# Patient Record
Sex: Female | Born: 1940 | Race: White | Hispanic: No | Marital: Single | Smoking: Former smoker
Health system: Southern US, Community
[De-identification: ages and names within clinical notes are randomized; demographics above are authoritative.]

## PROBLEM LIST (undated history)

## (undated) DIAGNOSIS — M8580 Other specified disorders of bone density and structure, unspecified site: Secondary | ICD-10-CM

## (undated) DIAGNOSIS — B029 Zoster without complications: Secondary | ICD-10-CM

## (undated) DIAGNOSIS — E785 Hyperlipidemia, unspecified: Secondary | ICD-10-CM

## (undated) DIAGNOSIS — N8110 Cystocele, unspecified: Secondary | ICD-10-CM

## (undated) DIAGNOSIS — M1990 Unspecified osteoarthritis, unspecified site: Secondary | ICD-10-CM

## (undated) DIAGNOSIS — S83206A Unspecified tear of unspecified meniscus, current injury, right knee, initial encounter: Secondary | ICD-10-CM

## (undated) DIAGNOSIS — I2510 Atherosclerotic heart disease of native coronary artery without angina pectoris: Secondary | ICD-10-CM

## (undated) DIAGNOSIS — D692 Other nonthrombocytopenic purpura: Secondary | ICD-10-CM

## (undated) DIAGNOSIS — D649 Anemia, unspecified: Secondary | ICD-10-CM

## (undated) DIAGNOSIS — K52831 Collagenous colitis: Secondary | ICD-10-CM

## (undated) HISTORY — DX: Unspecified osteoarthritis, unspecified site: M19.90

## (undated) HISTORY — DX: Unspecified tear of unspecified meniscus, current injury, right knee, initial encounter: S83.206A

## (undated) HISTORY — DX: Anemia, unspecified: D64.9

## (undated) HISTORY — DX: Atherosclerotic heart disease of native coronary artery without angina pectoris: I25.10

## (undated) HISTORY — DX: Hyperlipidemia, unspecified: E78.5

## (undated) HISTORY — DX: Zoster without complications: B02.9

## (undated) HISTORY — DX: Other specified disorders of bone density and structure, unspecified site: M85.80

## (undated) HISTORY — DX: Other nonthrombocytopenic purpura: D69.2

## (undated) HISTORY — DX: Collagenous colitis: K52.831

## (undated) HISTORY — DX: Cystocele, unspecified: N81.10

## (undated) HISTORY — PX: BREAST EXCISIONAL BIOPSY: SUR124

## (undated) HISTORY — PX: ABDOMINAL HYSTERECTOMY: SHX81

---

## 2014-08-30 ENCOUNTER — Encounter: Payer: Self-pay | Admitting: Internal Medicine

## 2014-08-31 LAB — PULMONARY FUNCTION TEST

## 2014-09-28 ENCOUNTER — Encounter: Payer: Self-pay | Admitting: Internal Medicine

## 2014-11-08 ENCOUNTER — Encounter: Payer: Self-pay | Admitting: Internal Medicine

## 2015-03-04 DIAGNOSIS — M810 Age-related osteoporosis without current pathological fracture: Secondary | ICD-10-CM | POA: Diagnosis not present

## 2015-03-04 DIAGNOSIS — M546 Pain in thoracic spine: Secondary | ICD-10-CM | POA: Diagnosis not present

## 2015-03-04 DIAGNOSIS — Z6824 Body mass index (BMI) 24.0-24.9, adult: Secondary | ICD-10-CM | POA: Diagnosis not present

## 2015-03-21 DIAGNOSIS — Z6824 Body mass index (BMI) 24.0-24.9, adult: Secondary | ICD-10-CM | POA: Diagnosis not present

## 2015-03-21 DIAGNOSIS — M1990 Unspecified osteoarthritis, unspecified site: Secondary | ICD-10-CM | POA: Diagnosis not present

## 2015-03-21 DIAGNOSIS — M546 Pain in thoracic spine: Secondary | ICD-10-CM | POA: Diagnosis not present

## 2015-06-20 ENCOUNTER — Other Ambulatory Visit: Payer: Self-pay

## 2015-06-20 DIAGNOSIS — Z1231 Encounter for screening mammogram for malignant neoplasm of breast: Secondary | ICD-10-CM

## 2015-07-08 ENCOUNTER — Ambulatory Visit
Admission: RE | Admit: 2015-07-08 | Discharge: 2015-07-08 | Disposition: A | Discharge status: Home / Self Care | Payer: PPO | Source: Ambulatory Visit

## 2015-07-08 DIAGNOSIS — Z1231 Encounter for screening mammogram for malignant neoplasm of breast: Secondary | ICD-10-CM | POA: Diagnosis not present

## 2015-08-17 DIAGNOSIS — E784 Other hyperlipidemia: Secondary | ICD-10-CM | POA: Diagnosis not present

## 2015-08-17 DIAGNOSIS — M810 Age-related osteoporosis without current pathological fracture: Secondary | ICD-10-CM | POA: Diagnosis not present

## 2015-08-24 DIAGNOSIS — D692 Other nonthrombocytopenic purpura: Secondary | ICD-10-CM | POA: Diagnosis not present

## 2015-08-24 DIAGNOSIS — N329 Bladder disorder, unspecified: Secondary | ICD-10-CM | POA: Diagnosis not present

## 2015-08-24 DIAGNOSIS — M1990 Unspecified osteoarthritis, unspecified site: Secondary | ICD-10-CM | POA: Diagnosis not present

## 2015-08-24 DIAGNOSIS — I6529 Occlusion and stenosis of unspecified carotid artery: Secondary | ICD-10-CM | POA: Diagnosis not present

## 2015-08-24 DIAGNOSIS — E7800 Pure hypercholesterolemia, unspecified: Secondary | ICD-10-CM | POA: Diagnosis not present

## 2015-08-24 DIAGNOSIS — Z0000 Encounter for general adult medical examination without abnormal findings: Secondary | ICD-10-CM | POA: Diagnosis not present

## 2015-08-24 DIAGNOSIS — M810 Age-related osteoporosis without current pathological fracture: Secondary | ICD-10-CM | POA: Diagnosis not present

## 2015-08-24 DIAGNOSIS — Z1389 Encounter for screening for other disorder: Secondary | ICD-10-CM | POA: Diagnosis not present

## 2015-08-24 DIAGNOSIS — S83209S Unspecified tear of unspecified meniscus, current injury, unspecified knee, sequela: Secondary | ICD-10-CM | POA: Diagnosis not present

## 2015-08-24 DIAGNOSIS — B029 Zoster without complications: Secondary | ICD-10-CM | POA: Diagnosis not present

## 2015-08-24 DIAGNOSIS — Z6824 Body mass index (BMI) 24.0-24.9, adult: Secondary | ICD-10-CM | POA: Diagnosis not present

## 2015-08-24 DIAGNOSIS — D649 Anemia, unspecified: Secondary | ICD-10-CM | POA: Diagnosis not present

## 2015-08-24 DIAGNOSIS — Z23 Encounter for immunization: Secondary | ICD-10-CM | POA: Diagnosis not present

## 2015-12-14 DIAGNOSIS — Z0100 Encounter for examination of eyes and vision without abnormal findings: Secondary | ICD-10-CM | POA: Diagnosis not present

## 2015-12-14 DIAGNOSIS — H2513 Age-related nuclear cataract, bilateral: Secondary | ICD-10-CM | POA: Diagnosis not present

## 2016-06-06 ENCOUNTER — Other Ambulatory Visit: Payer: Self-pay | Admitting: Internal Medicine

## 2016-06-06 DIAGNOSIS — Z1231 Encounter for screening mammogram for malignant neoplasm of breast: Secondary | ICD-10-CM

## 2016-07-13 ENCOUNTER — Ambulatory Visit
Admission: RE | Admit: 2016-07-13 | Discharge: 2016-07-13 | Disposition: A | Discharge status: Home / Self Care | Payer: PPO | Source: Ambulatory Visit | Attending: Internal Medicine | Admitting: Internal Medicine

## 2016-07-13 DIAGNOSIS — Z1231 Encounter for screening mammogram for malignant neoplasm of breast: Secondary | ICD-10-CM | POA: Diagnosis not present

## 2016-08-16 LAB — LIPID PANEL
CHOLESTEROL: 126 (ref 0–200)
HDL: 46 (ref 35–70)
LDL Cholesterol: 68
TRIGLYCERIDES: 58 (ref 40–160)

## 2016-08-16 LAB — BASIC METABOLIC PANEL
BUN: 20 (ref 4–21)
CREATININE: 0.9 (ref 0.5–1.1)
Glucose: 88
POTASSIUM: 4.8 (ref 3.4–5.3)
SODIUM: 139 (ref 137–147)

## 2016-08-16 LAB — HEPATIC FUNCTION PANEL
ALT: 16 (ref 7–35)
AST: 23 (ref 13–35)
Alkaline Phosphatase: 43 (ref 25–125)
Bilirubin, Total: 0.6

## 2016-08-16 LAB — VITAMIN D 25 HYDROXY (VIT D DEFICIENCY, FRACTURES): Vit D, 25-Hydroxy: 41.8

## 2016-08-21 DIAGNOSIS — M859 Disorder of bone density and structure, unspecified: Secondary | ICD-10-CM | POA: Diagnosis not present

## 2016-08-21 DIAGNOSIS — E7800 Pure hypercholesterolemia, unspecified: Secondary | ICD-10-CM | POA: Diagnosis not present

## 2016-08-21 LAB — HEPATIC FUNCTION PANEL
ALK PHOS: 46 (ref 25–125)
ALT: 20 (ref 7–35)
AST: 22 (ref 13–35)
Bilirubin, Total: 0.6

## 2016-08-21 LAB — BASIC METABOLIC PANEL
BUN: 19 (ref 4–21)
Creatinine: 0.8 (ref 0.5–1.1)
Glucose: 85
Potassium: 4.7 (ref 3.4–5.3)
Sodium: 137 (ref 137–147)

## 2016-08-21 LAB — CBC AND DIFFERENTIAL
HCT: 38 (ref 36–46)
HEMOGLOBIN: 13 (ref 12.0–16.0)
PLATELETS: 227 (ref 150–399)
WBC: 5

## 2016-08-21 LAB — LIPID PANEL
CHOLESTEROL: 127 (ref 0–200)
HDL: 48 (ref 35–70)
LDL CALC: 65
TRIGLYCERIDES: 72 (ref 40–160)

## 2016-08-21 LAB — VITAMIN D 25 HYDROXY (VIT D DEFICIENCY, FRACTURES): VIT D 25 HYDROXY: 51

## 2016-08-23 DIAGNOSIS — Z1212 Encounter for screening for malignant neoplasm of rectum: Secondary | ICD-10-CM | POA: Diagnosis not present

## 2016-08-23 LAB — IFOBT (OCCULT BLOOD): IMMUNOLOGICAL FECAL OCCULT BLOOD TEST: NEGATIVE

## 2016-08-23 LAB — FECAL OCCULT BLOOD, GUAIAC: Fecal Occult Blood: NEGATIVE

## 2016-08-28 DIAGNOSIS — I6529 Occlusion and stenosis of unspecified carotid artery: Secondary | ICD-10-CM | POA: Diagnosis not present

## 2016-08-28 DIAGNOSIS — N3289 Other specified disorders of bladder: Secondary | ICD-10-CM | POA: Diagnosis not present

## 2016-08-28 DIAGNOSIS — Z1389 Encounter for screening for other disorder: Secondary | ICD-10-CM | POA: Diagnosis not present

## 2016-08-28 DIAGNOSIS — R05 Cough: Secondary | ICD-10-CM | POA: Diagnosis not present

## 2016-08-28 DIAGNOSIS — D6489 Other specified anemias: Secondary | ICD-10-CM | POA: Diagnosis not present

## 2016-08-28 DIAGNOSIS — M1990 Unspecified osteoarthritis, unspecified site: Secondary | ICD-10-CM | POA: Diagnosis not present

## 2016-08-28 DIAGNOSIS — E7800 Pure hypercholesterolemia, unspecified: Secondary | ICD-10-CM | POA: Diagnosis not present

## 2016-08-28 DIAGNOSIS — Z6825 Body mass index (BMI) 25.0-25.9, adult: Secondary | ICD-10-CM | POA: Diagnosis not present

## 2016-08-28 DIAGNOSIS — M859 Disorder of bone density and structure, unspecified: Secondary | ICD-10-CM | POA: Diagnosis not present

## 2016-08-28 DIAGNOSIS — Z0000 Encounter for general adult medical examination without abnormal findings: Secondary | ICD-10-CM | POA: Diagnosis not present

## 2016-11-27 DIAGNOSIS — R197 Diarrhea, unspecified: Secondary | ICD-10-CM | POA: Diagnosis not present

## 2016-11-27 DIAGNOSIS — Z6824 Body mass index (BMI) 24.0-24.9, adult: Secondary | ICD-10-CM | POA: Diagnosis not present

## 2016-11-27 LAB — CBC AND DIFFERENTIAL
HCT: 35 — AB (ref 36–46)
HEMOGLOBIN: 11.5 — AB (ref 12.0–16.0)
Platelets: 311 (ref 150–399)
WBC: 4.2

## 2016-11-27 LAB — BASIC METABOLIC PANEL
BUN: 10 (ref 4–21)
Creatinine: 0.8 (ref 0.5–1.1)
Glucose: 94
POTASSIUM: 4.2 (ref 3.4–5.3)
Sodium: 138 (ref 137–147)

## 2016-11-27 LAB — HEPATIC FUNCTION PANEL
ALT: 18 (ref 7–35)
AST: 22 (ref 13–35)
Alkaline Phosphatase: 47 (ref 25–125)
Bilirubin, Total: 0.5

## 2016-11-29 DIAGNOSIS — R197 Diarrhea, unspecified: Secondary | ICD-10-CM | POA: Diagnosis not present

## 2016-12-03 ENCOUNTER — Encounter: Payer: Self-pay | Admitting: Nurse Practitioner

## 2016-12-12 ENCOUNTER — Other Ambulatory Visit (INDEPENDENT_AMBULATORY_CARE_PROVIDER_SITE_OTHER): Payer: PPO

## 2016-12-12 ENCOUNTER — Encounter (INDEPENDENT_AMBULATORY_CARE_PROVIDER_SITE_OTHER): Payer: Self-pay

## 2016-12-12 ENCOUNTER — Ambulatory Visit: Payer: PPO | Admitting: Nurse Practitioner

## 2016-12-12 VITALS — BP 110/68 | HR 72 | Ht 63.0 in | Wt 124.0 lb

## 2016-12-12 DIAGNOSIS — R635 Abnormal weight gain: Secondary | ICD-10-CM | POA: Diagnosis not present

## 2016-12-12 DIAGNOSIS — R197 Diarrhea, unspecified: Secondary | ICD-10-CM

## 2016-12-12 LAB — CBC
HCT: 35 % — ABNORMAL LOW (ref 36.0–46.0)
Hemoglobin: 11.6 g/dL — ABNORMAL LOW (ref 12.0–15.0)
MCHC: 33.2 g/dL (ref 30.0–36.0)
MCV: 94.7 fl (ref 78.0–100.0)
Platelets: 492 10*3/uL — ABNORMAL HIGH (ref 150.0–400.0)
RBC: 3.69 Mil/uL — AB (ref 3.87–5.11)
RDW: 13.7 % (ref 11.5–15.5)
WBC: 6.5 10*3/uL (ref 4.0–10.5)

## 2016-12-12 LAB — BASIC METABOLIC PANEL
BUN: 15 mg/dL (ref 6–23)
CALCIUM: 9.8 mg/dL (ref 8.4–10.5)
CHLORIDE: 104 meq/L (ref 96–112)
CO2: 29 mEq/L (ref 19–32)
CREATININE: 0.78 mg/dL (ref 0.40–1.20)
GFR: 75.9 mL/min (ref >60.00)
Glucose, Bld: 101 mg/dL — ABNORMAL HIGH (ref 70–99)
Potassium: 4.4 mEq/L (ref 3.5–5.1)
Sodium: 139 mEq/L (ref 135–145)

## 2016-12-12 MED ORDER — NA SULFATE-K SULFATE-MG SULF 17.5-3.13-1.6 GM/177ML PO SOLN: ORAL | 0 refills | Status: DC

## 2016-12-12 NOTE — Progress Notes (Signed)
HPI: Patient is a 76 year old female, new to this practice, referred by PCP, Dr. Wylene Simmerisovec for diarrhea. Diarrhea started around 11/16/16 and is mainly postprandial.  Stool watery up to 10 times a day and she has some nocturnal stooling as well. Has intestinal gurgling during the night and has had a few accidents. She had been trying to lose weight before this illness but wasn't successful , since diarrhea started she unintentionally lost several pounds. Patient has never be prone to having loose stools. She tried anti-diarrheal pills but they didn't work so she saw Willis ModenaSue Drinkard, NP.  No medication changes to correlate with onset of diarrhea. No dietary changes. No recent antibiotics or out of country travel. She is able to eat a few things such as toast and rice without having almost immediate diarrhea.  She complains of feeling lightheaded over last couple of weeks, otherwise feels okay. She is drinking pedialyte, Gingerale and water.   [place] and no [place]ps per patient. Declined colonoscopy in 2012 because she wasn't having any problems,   She passed a black stool yesterday and also one in the week prior in the absence of bismuth.   She has intermittent bright red blood on tissue over last few months but attributes it to a hemorrhoid.   PMH: Brother died of unknown cancer   Past Surgical History:  Procedure Laterality Date  . ABDOMINAL HYSTERECTOMY    . BREAST EXCISIONAL BIOPSY     No family history on file. Social History   Tobacco Use  . Smoking status: Not on file  Substance Use Topics  . Alcohol use: Not on file  . Drug use: Not on file   Current Outpatient Medications  Medication Sig Dispense Refill  . Ascorbic Acid (VITAMIN C) 1000 MG tablet Take 1,000 mg daily by mouth.    . B Complex-C-Folic Acid (STRESS B COMPLEX PO) Take by mouth.    . calcium carbonate (OS-CAL - DOSED IN MG OF ELEMENTAL CALCIUM) 1250 (500 Ca) MG tablet Take 1  tablet by mouth.    . cholecalciferol (VITAMIN D) 1000 units tablet Take 1,000 Units 2 (two) times daily by mouth.    . Flaxseed, Linseed, (FLAXSEED OIL PO) Take by mouth.    . Garlic 1000 MG CAPS Take by mouth.    . Multiple Vitamin (MULTIVITAMIN) capsule Take 1 capsule daily by mouth.     No current facility-administered medications for this visit.    Allergies not on file   Review of Systems: All systems reviewed and negative except where noted in HPI.    Physical Exam: BP 110/68   Pulse 72   Ht 5\' 3"  (1.6 m)   Wt 124 lb (56.2 kg)   BMI 21.97 kg/m  Constitutional:  Well-developed, whitefemale in no acute distress. Psychiatric: Normal mood and affect. Behavior is normal. EENT: Pupils normal.  Conjunctivae are normal. No scleral icterus. Neck supple.  Cardiovascular: Normal rate, regular rhythm. No edema Pulmonary/chest: Effort normal and breath sounds normal. No wheezing, rales or rhonchi. Abdominal: Soft, nondistended. Nontender. Bowel sounds active throughout. There are no masses palpable. No hepatomegaly. Lymphadenopathy: No cervical adenopathy noted. Neurological: Alert and oriented to person place and time. Skin: Skin is warm and dry. No rashes noted.   ASSESSMENT AND PLAN:  1. Very pleasant, relatively healthy  76 yo female with several weeks of diarrhea associated with some mild weight loss. Some intermittent rectal bleeding which she attributes to  hemorrhoids. I was able to get stool study results from PCP and they negative.   -Patient is will and able to proceed with a colonoscopy with possible biopsies / polypectomy.  The risks and benefits of the procedure were discussed and the patient agrees to proceed.  -CBC, BMET  Willette ClusterPaula Zya Finkle, NP  12/12/2016, 2:06 PM  Cc: Tisovec, Adelfa Kohichard W, MD

## 2016-12-12 NOTE — Patient Instructions (Signed)
If you are age 78 or older, your body mass index should be between 23-30. Your Body mass index is 21.97 kg/m. If this is out of the aforementioned range listed, please consider follow up with your Primary Care Provider.  If you are age 78 or younger, your body mass index should be between 19-25. Your Body mass index is 21.97 kg/m. If this is out of the aformentioned range listed, please consider follow up with your Primary Care Provider.   Your physician has requested that you go to the basement for the following lab work before leaving today: CBC BMET  You have been scheduled for a colonoscopy. Please follow written instructions given to you at your visit today.  Please pick up your prep supplies at the pharmacy within the next 1-3 days. If you use inhalers (even only as needed), please bring them with you on the day of your procedure. Your physician has requested that you go to www.startemmi.com and enter the access code given to you at your visit today. This web site gives a general overview about your procedure. However, you should still follow specific instructions given to you by our office regarding your preparation for the procedure.  We have sent the following medications to your pharmacy for you to pick up at your convenience: Suprep  Thank you for choosing me and [facility].   Willette ClusterPaula Guenther, NP

## 2016-12-14 ENCOUNTER — Other Ambulatory Visit: Payer: Self-pay

## 2016-12-14 ENCOUNTER — Encounter: Payer: Self-pay | Admitting: Nurse Practitioner

## 2016-12-14 ENCOUNTER — Encounter: Payer: Self-pay | Admitting: Gastroenterology

## 2016-12-14 ENCOUNTER — Ambulatory Visit (AMBULATORY_SURGERY_CENTER): Payer: PPO | Admitting: Gastroenterology

## 2016-12-14 VITALS — BP 113/50 | HR 68 | Temp 97.3°F | Resp 9 | Ht 63.0 in | Wt 124.0 lb

## 2016-12-14 DIAGNOSIS — K529 Noninfective gastroenteritis and colitis, unspecified: Secondary | ICD-10-CM | POA: Diagnosis not present

## 2016-12-14 DIAGNOSIS — R197 Diarrhea, unspecified: Secondary | ICD-10-CM

## 2016-12-14 LAB — HM COLONOSCOPY

## 2016-12-14 MED ORDER — SODIUM CHLORIDE 0.9 % IV SOLN: 500.0000 mL | INTRAVENOUS | Status: DC

## 2016-12-14 MED ORDER — DIPHENOXYLATE-ATROPINE 2.5-0.025 MG PO TABS: 1.0000 | ORAL_TABLET | Freq: Three times a day (TID) | ORAL | 1 refills | Status: DC

## 2016-12-14 NOTE — Op Note (Signed)
[facility] Patient Name: Vanessa Chavez Procedure Date: 12/14/2016 12:40 PM MRN: [id number] Endoscopist: Viviann Spare P. Kasim Mccorkle MD, MD Age: 76 Referring MD:  Date of Birth: [birth year] Gender: Female Account #: [id number] Procedure:                Colonoscopy Indications:              Chronic diarrhea Medicines:                Monitored Anesthesia Care Procedure:                Pre-Anesthesia Assessment:                           - Prior to the procedure, a History and Physical                            was performed, and patient medications and                            allergies were reviewed. The patient's tolerance of                            previous anesthesia was also reviewed. The risks                            and benefits of the procedure and the sedation                            options and risks were discussed with the patient.                            All questions were answered, and informed consent                            was obtained. Prior Anticoagulants: The patient has                            taken no previous anticoagulant or antiplatelet                            agents. ASA Grade Assessment: II - A patient with                            mild systemic disease. After reviewing the risks                            and benefits, the patient was deemed in                            satisfactory condition to undergo the procedure.                           After obtaining informed consent, the colonoscope  was passed under direct vision. Throughout the                            procedure, the patient's blood pressure, pulse, and                            oxygen saturations were monitored continuously. The                            Colonoscope was introduced through the anus and                            advanced to the the cecum, identified by                            appendiceal orifice and ileocecal valve.  The                            colonoscopy was technically difficult and complex                            due to a tortuous colon. The patient tolerated the                            procedure well. The quality of the bowel                            preparation was adequate. The ileocecal valve,                            appendiceal orifice, and rectum were photographed. Scope In: 1:26:12 PM Scope Out: 1:49:39 PM Scope Withdrawal Time: 0 hours 17 minutes 10 seconds  Total Procedure Duration: 0 hours 23 minutes 27 seconds  Findings:                 The perianal and digital rectal examinations were                            normal.                           A single medium-mouthed diverticulum was found in                            the cecum.                           The colon was significantly tortuous which                            prolonged the procedure.                           Internal hemorrhoids were found during  retroflexion. The hemorrhoids were small.                           The exam was otherwise without abnormality. No                            overt inflammatory changes.                           Biopsies for histology were taken with a cold                            forceps from the right colon, left colon and                            transverse colon for evaluation of microscopic                            colitis. Complications:            No immediate complications. Estimated blood loss:                            Minimal. Estimated Blood Loss:     Estimated blood loss was minimal. Impression:               - Diverticulosis in the cecum.                           - Tortuous colon.                           - Internal hemorrhoids.                           - The examination was otherwise normal.                           - Biopsies were taken with a cold forceps from the                            right colon, left colon and  transverse colon for                            evaluation of microscopic colitis. Recommendation:           - Patient has a contact number available for                            emergencies. The signs and symptoms of potential                            delayed complications were discussed with the                            patient. Return to normal activities tomorrow.  Written discharge instructions were provided to the                            patient.                           - Resume previous diet.                           - Continue present medications.                           - Trial of immodium daily if not already done                           - Await pathology results.                           - No repeat colonoscopy screening is needed due to                            age. Willaim RayasSteven P. Jishnu Jenniges MD, MD 12/14/2016 1:54:46 PM This report has been signed electronically.

## 2016-12-14 NOTE — Progress Notes (Signed)
To PACU, VSS. Report to [email or web address] 

## 2016-12-14 NOTE — Patient Instructions (Signed)
YOU HAD AN ENDOSCOPIC PROCEDURE TODAY AT THE [facility]:   Refer to the procedure report that was given to you for any specific questions about what was found during the examination.  If the procedure report does not answer your questions, please call your gastroenterologist to clarify.  If you requested that your care partner not be given the details of your procedure findings, then the procedure report has been included in a sealed envelope for you to review at your convenience later.  YOU SHOULD EXPECT: Some feelings of bloating in the abdomen. Passage of more gas than usual.  Walking can help get rid of the air that was put into your GI tract during the procedure and reduce the bloating. If you had a lower endoscopy (such as a colonoscopy or flexible sigmoidoscopy) you may notice spotting of blood in your stool or on the toilet paper. If you underwent a bowel prep for your procedure, you may not have a normal bowel movement for a few days.  Please Note:  You might notice some irritation and congestion in your nose or some drainage.  This is from the oxygen used during your procedure.  There is no need for concern and it should clear up in a day or so.  SYMPTOMS TO REPORT IMMEDIATELY:   Following lower endoscopy (colonoscopy or flexible sigmoidoscopy):  Excessive amounts of blood in the stool  Significant tenderness or worsening of abdominal pains  Swelling of the abdomen that is new, acute  Fever of 100F or higher   Following upper endoscopy (EGD)  Vomiting of blood or coffee ground material  New chest pain or pain under the shoulder blades  Painful or persistently difficult swallowing  New shortness of breath  Fever of 100F or higher  Black, tarry-looking stools  For urgent or emergent issues, a gastroenterologist can be reached at any hour by calling [phone].   DIET:  We do recommend a small meal at first, but then you may proceed to your regular diet.  Drink  plenty of fluids but you should avoid alcoholic beverages for 24 hours.  ACTIVITY:  You should plan to take it easy for the rest of today and you should NOT DRIVE or use heavy machinery until tomorrow (because of the sedation medicines used during the test).    FOLLOW UP: Our staff will call the number listed on your records the next business day following your procedure to check on you and address any questions or concerns that you may have regarding the information given to you following your procedure. If we do not reach you, we will leave a message.  However, if you are feeling well and you are not experiencing any problems, there is no need to return our call.  We will assume that you have returned to your regular daily activities without incident.  If any biopsies were taken you will be contacted by phone or by letter within the next 1-3 weeks.  Please call us at [phone] if you have not heard about the biopsies in 3 weeks.    SIGNATURES/CONFIDENTIALITY: You and/or your care partner have signed paperwork which will be entered into your electronic medical record.  These signatures attest to the fact that that the information above on your After Visit Summary has been reviewed and is understood.  Full responsibility of the confidentiality of this discharge information lies with you and/or your care-partner.  Diverticulosis, hemorrhoid  informatin given.  Lomotil one tablet every 8  hours as needed.

## 2016-12-14 NOTE — Progress Notes (Signed)
Agree with assessment and plan as outlined.  She warrants a colonoscopy to rule out microscopic colitis and for screening purposes.

## 2016-12-14 NOTE — Progress Notes (Signed)
Called to room to assist during endoscopic procedure.  Patient ID and intended procedure confirmed with present staff. Received instructions for my participation in the procedure from the performing physician.  

## 2016-12-17 ENCOUNTER — Telehealth: Payer: Self-pay | Admitting: *Deleted

## 2016-12-17 NOTE — Telephone Encounter (Signed)
Patient states she had diarrhea Saturday and Sunday but has been taking lomotil and wants to know if that's okay.

## 2016-12-17 NOTE — Telephone Encounter (Signed)
  Follow up Call-  Call back number 12/14/2016  Post procedure Call Back phone  # [phone]  Permission to leave phone message Yes  Some recent data might be hidden     Patient questions:  Message left. Second call.

## 2016-12-17 NOTE — Telephone Encounter (Signed)
  Follow up Call-  Call back number 12/14/2016  Post procedure Call Back phone  # [phone]  Permission to leave phone message Yes  Some recent data might be hidden     Patient questions:  Message left to call us if necessary.

## 2016-12-18 ENCOUNTER — Telehealth: Payer: Self-pay | Admitting: Gastroenterology

## 2016-12-18 NOTE — Telephone Encounter (Signed)
Just sent you a results note for her path from colonoscopy - she is positive for microscopic colitis and have recommended budesonide which will treat her diarrhea. She can take lomotil PRN but don't suspect she will need it after she starts budesonide. Thanks

## 2016-12-18 NOTE — Telephone Encounter (Signed)
Please advise on lomotil, Rx states every 8 hours or do you want it prn. Thanks.

## 2016-12-19 ENCOUNTER — Other Ambulatory Visit: Payer: Self-pay

## 2016-12-19 MED ORDER — BUDESONIDE 3 MG PO CPEP: ORAL_CAPSULE | ORAL | 0 refills | Status: DC

## 2017-02-12 ENCOUNTER — Other Ambulatory Visit: Payer: Self-pay | Admitting: Gastroenterology

## 2017-02-12 NOTE — Telephone Encounter (Signed)
Pt is requesting refill of budesonide.  Did you intend for her to stay on this medication? Thanks

## 2017-02-12 NOTE — Telephone Encounter (Signed)
Vanessa Chavez she has a follow up appointment with me on Thursday, I will address this with her at that time. Can you verify she will be at the appointment? Thanks

## 2017-02-13 NOTE — Telephone Encounter (Signed)
Called and left message for pt that the Rx will be discussed at her appt with Dr. Adela LankArmbruster tomorrow, Thursday, 02-14-17.  Told her to call me if she had any questions.

## 2017-02-14 ENCOUNTER — Encounter (INDEPENDENT_AMBULATORY_CARE_PROVIDER_SITE_OTHER): Payer: Self-pay

## 2017-02-14 ENCOUNTER — Ambulatory Visit (INDEPENDENT_AMBULATORY_CARE_PROVIDER_SITE_OTHER): Payer: PPO | Admitting: Gastroenterology

## 2017-02-14 ENCOUNTER — Encounter: Payer: Self-pay | Admitting: Gastroenterology

## 2017-02-14 VITALS — BP 138/78 | HR 84 | Ht 60.0 in | Wt 120.5 lb

## 2017-02-14 DIAGNOSIS — K52831 Collagenous colitis: Secondary | ICD-10-CM

## 2017-02-14 NOTE — Patient Instructions (Addendum)
If you are age 79 or older, your body mass index should be between 23-30. Your Body mass index is 23.53 kg/m. If this is out of the aforementioned range listed, please consider follow up with your Primary Care Provider.  If you are age 79 or younger, your body mass index should be between 19-25. Your Body mass index is 23.53 kg/m. If this is out of the aformentioned range listed, please consider follow up with your Primary Care Provider.   Please follow up with us as needed.   Thank you for entrusting me with your care and [facility], Dr. Ileene PatrickSteven Armbruster

## 2017-02-14 NOTE — Progress Notes (Signed)
HPI :  77 year old female here for a follow-up visit for microscopic colitis.   She was initially referred to see us in November for severe diarrhea. She underwent a colonoscopy on November 9, showing no polyps and a normal appearing colon. 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 She endorsed taking Aleve roughly once per day for a long time proceeding this diagnosis. She was advised to stop taking Aleve which she has done. She was given a course of budesonide 9 mg over 1 month with a taper over the next month. She states this completely resolved her diarrhea. She is having one formed bowel movement per day. She denies any abdominal pain. She endorses feeling very well. She was very frustrated by the cost of budesonide which was almost $300.  Colonoscopy 12/14/2016 - cecal diverticulum, otherwise normal than hemorrhoids - biopsies consistent with collagenous coliis    Past Medical History:  Diagnosis Date  . Collagenous colitis      Past Surgical History:  Procedure Laterality Date  . ABDOMINAL HYSTERECTOMY    . BREAST EXCISIONAL BIOPSY     History reviewed. No pertinent family history. Social History   Tobacco Use  . Smoking status: Former Smoker    Last attempt to quit: 2008    Years since quitting: 11.0  . Smokeless tobacco: Never Used  Substance Use Topics  . Alcohol use: Yes    Comment: occasional  . Drug use: No   Current Outpatient Medications  Medication Sig Dispense Refill  . Ascorbic Acid (VITAMIN C) 1000 MG tablet Take 1,000 mg daily by mouth.    . B Complex-C-Folic Acid (STRESS B COMPLEX PO) Take by mouth.    . calcium carbonate (OS-CAL - DOSED IN MG OF ELEMENTAL CALCIUM) 1250 (500 Ca) MG tablet Take 1 tablet by mouth.    . cholecalciferol (VITAMIN D) 1000 units tablet Take 1,000 Units 2 (two) times daily by mouth.    . Flaxseed, Linseed, (FLAXSEED OIL PO) Take by mouth.    . Garlic 1000 MG CAPS Take by mouth.    . Multiple Vitamin (MULTIVITAMIN)  capsule Take 1 capsule daily by mouth.     No current facility-administered medications for this visit.    No Known Allergies   Review of Systems: All systems reviewed and negative except where noted in HPI.    No results found.  Physical Exam: BP 138/78 (BP Location: Left Arm, Patient Position: Sitting, Cuff Size: Normal)   Pulse 84   Ht 5' (1.524 m) Comment: height measured without shoes  Wt 120 lb 8 oz (54.7 kg)   BMI 23.53 kg/m  Constitutional: Pleasant,well-developed, female in no acute distress. HEENT: Normocephalic and atraumatic. Conjunctivae are normal. No scleral icterus. Neck supple.  Cardiovascular: Normal rate, regular rhythm.  Pulmonary/chest: Effort normal and breath sounds normal. No wheezing, rales or rhonchi. Abdominal: Soft, nondistended, nontender. There are no masses palpable. No hepatomegaly. Extremities: no edema Lymphadenopathy: No cervical adenopathy noted. Neurological: Alert and oriented to person place and time. Skin: Skin is warm and dry. No rashes noted. Psychiatric: Normal mood and affect. Behavior is normal.   ASSESSMENT AND PLAN: 77 year old female here for follow-up for collagenous colitis, diagnosed this past November. I suspect this is very likely due to NSAID use. I discussed this with the patient and recommend she completely avoid NSAIDs if possible. She can use low-dose Tylenol as needed for arthritis pain.   She is responded very well to stopping NSAIDs and a course of budesonide. Hopefully  if NSAIDs were driving this process, she has not had any recurrence. I counseled her however, that her symptoms could come back over time. If this happens she should contact me for guidance, we may try to manage this with Imodium as the cost of budesonide was quite high for her. She agreed the plan, and follow-up as needed for this issue.  Ileene Patrick, MD [facility] Pager [phone]

## 2017-02-15 ENCOUNTER — Telehealth: Payer: Self-pay | Admitting: Gastroenterology

## 2017-02-15 NOTE — Telephone Encounter (Signed)
Spoke to patient, she had an episode of diarrhea yesterday afternoon, had to take two doses of imodium. This helped calm down everything and feels better today. Looking at yesterday's note, she is instructed to continue treating her symptomatic diarrhea with imodium and understands to call back if this does not help.

## 2017-03-06 DIAGNOSIS — H2513 Age-related nuclear cataract, bilateral: Secondary | ICD-10-CM | POA: Diagnosis not present

## 2017-03-28 DIAGNOSIS — H2511 Age-related nuclear cataract, right eye: Secondary | ICD-10-CM | POA: Diagnosis not present

## 2017-03-28 DIAGNOSIS — H25811 Combined forms of age-related cataract, right eye: Secondary | ICD-10-CM | POA: Diagnosis not present

## 2017-04-05 HISTORY — PX: CATARACT EXTRACTION: SUR2

## 2017-04-25 DIAGNOSIS — H2512 Age-related nuclear cataract, left eye: Secondary | ICD-10-CM | POA: Diagnosis not present

## 2017-04-25 DIAGNOSIS — H25812 Combined forms of age-related cataract, left eye: Secondary | ICD-10-CM | POA: Diagnosis not present

## 2017-05-06 HISTORY — PX: CATARACT EXTRACTION: SUR2

## 2017-06-26 ENCOUNTER — Other Ambulatory Visit: Payer: Self-pay | Admitting: Nurse Practitioner

## 2017-06-26 ENCOUNTER — Other Ambulatory Visit: Payer: Self-pay | Admitting: Internal Medicine

## 2017-06-26 DIAGNOSIS — Z1231 Encounter for screening mammogram for malignant neoplasm of breast: Secondary | ICD-10-CM

## 2017-07-19 ENCOUNTER — Ambulatory Visit
Admission: RE | Admit: 2017-07-19 | Discharge: 2017-07-19 | Disposition: A | Discharge status: Home / Self Care | Payer: Medicare HMO | Source: Ambulatory Visit | Attending: Nurse Practitioner | Admitting: Nurse Practitioner

## 2017-07-19 DIAGNOSIS — Z1231 Encounter for screening mammogram for malignant neoplasm of breast: Secondary | ICD-10-CM

## 2017-08-06 ENCOUNTER — Ambulatory Visit: Payer: Medicare HMO | Admitting: Nurse Practitioner

## 2017-09-25 ENCOUNTER — Encounter: Payer: Self-pay | Admitting: Internal Medicine

## 2017-11-12 ENCOUNTER — Encounter: Payer: Self-pay | Admitting: Nurse Practitioner

## 2017-11-12 ENCOUNTER — Ambulatory Visit (INDEPENDENT_AMBULATORY_CARE_PROVIDER_SITE_OTHER): Payer: Medicare HMO | Admitting: Nurse Practitioner

## 2017-11-12 VITALS — BP 124/82 | HR 78 | Temp 98.0°F | Ht 60.0 in | Wt 128.8 lb

## 2017-11-12 DIAGNOSIS — I739 Peripheral vascular disease, unspecified: Secondary | ICD-10-CM | POA: Diagnosis not present

## 2017-11-12 DIAGNOSIS — R413 Other amnesia: Secondary | ICD-10-CM

## 2017-11-12 DIAGNOSIS — I779 Disorder of arteries and arterioles, unspecified: Secondary | ICD-10-CM | POA: Insufficient documentation

## 2017-11-12 DIAGNOSIS — E785 Hyperlipidemia, unspecified: Secondary | ICD-10-CM

## 2017-11-12 DIAGNOSIS — M810 Age-related osteoporosis without current pathological fracture: Secondary | ICD-10-CM | POA: Diagnosis not present

## 2017-11-12 DIAGNOSIS — D649 Anemia, unspecified: Secondary | ICD-10-CM

## 2017-11-12 NOTE — Patient Instructions (Addendum)
Follow up in 4 weeks for AWV with sara THEN schedule Physical  Fasting blood work prior to appt.

## 2017-11-12 NOTE — Progress Notes (Signed)
Careteam: Patient Care Team: Kirt Boys, DO as PCP - General (Internal Medicine) Armbruster, Willaim Rayas, MD as Consulting Physician (Gastroenterology) Loletha Carrow, MD as Consulting Physician (Ophthalmology)  Advanced Directive information Does Patient Have a Medical Advance Directive?: Yes, Type of Advance Directive: Healthcare Power of [place];Living will  No Known Allergies  Chief Complaint  Patient presents with  . Medical Management of Chronic Issues    pt is being seen to establish care. Pt was previously seen at [facility]. Records received.    HPI: Patient is a 77 y.o. female seen in the office today to establish care.  Changing from [facility], had only been going yearly and it has been a year in July- this was her last physical.   Hyperlipidemia- previously on simvastatin, has not been taking for 4 months.   Anemia- taking iron daily  Taking lot of supplements because she read they were good for her health.   Hx of colitis- Following with Dr Adela Lank, GI due to diarrhea. Was told this was due to NSAIDS  OA- in neck and knees.   Osteopenia- currently cal icum and vit D  Moved to [place] 12 years ago to be closer to her daughter.  Used to be an assistance to a internal medicine doctor.   Review of Systems:  Review of Systems  Constitutional: Negative for chills, fever and weight loss.  HENT: Negative for tinnitus.   Respiratory: Negative for cough, sputum production and shortness of breath.   Cardiovascular: Negative for chest pain, palpitations and leg swelling.  Gastrointestinal: Negative for abdominal pain, constipation, diarrhea and heartburn.       Incontinent of stool   Genitourinary: Negative for dysuria, frequency and urgency.  Musculoskeletal: Positive for joint pain (knees and neck). Negative for back pain, falls and myalgias.  Skin: Negative.   Neurological: Negative for dizziness and headaches.    Psychiatric/Behavioral: Negative for depression and memory loss. The patient does not have insomnia.     Past Medical History:  Diagnosis Date  . CAD (coronary artery disease)   . Collagenous colitis   . Female bladder prolapse   . Herpes zoster   . Hyperlipidemia   . Normocytic anemia   . Osteoarthritis   . Osteopenia   . Right knee meniscal tear   . Senile purpura (HCC)    Past Surgical History:  Procedure Laterality Date  . ABDOMINAL HYSTERECTOMY    . BREAST EXCISIONAL BIOPSY    . CATARACT EXTRACTION Right 04/2017  . CATARACT EXTRACTION Left 05/2017   Social History:   reports that she quit smoking about 11 years ago. She has a 60.00 pack-year smoking history. She has never used smokeless tobacco. She reports that she drinks alcohol. She reports that she does not use drugs.  Family History  Problem Relation Age of Onset  . Pneumonia Mother 44  . Hypertension Mother   . Cirrhosis Father 58       liver  . Cancer - Lung Father   . Cancer Brother        sinuses  . Suicidality Son   . Heart Problems Maternal Grandmother 28  . Cancer Paternal Grandmother   . Cancer Paternal Grandfather 69    Medications: Patient's Medications  New Prescriptions   No medications on file  Previous Medications   ASCORBIC ACID (VITAMIN C) 1000 MG TABLET    Take 1,000 mg daily by mouth.   B COMPLEX-C-FOLIC ACID (STRESS B COMPLEX PO)  Take by mouth.   CALCIUM CARBONATE (OS-CAL - DOSED IN MG OF ELEMENTAL CALCIUM) 1250 (500 CA) MG TABLET    Take 1 tablet by mouth.   CHOLECALCIFEROL (VITAMIN D) 1000 UNITS TABLET    Take 1,000 Units 2 (two) times daily by mouth.   GARLIC 1000 MG CAPS    Take by mouth.   MULTIPLE VITAMIN (MULTIVITAMIN) CAPSULE    Take 1 capsule daily by mouth.   SIMVASTATIN (ZOCOR) 20 MG TABLET    Take 20 mg by mouth daily.  Modified Medications   No medications on file  Discontinued Medications   FLAXSEED, LINSEED, (FLAXSEED OIL PO)    Take by mouth.     Physical  Exam:  Vitals:   11/12/17 0819  BP: 124/82  Pulse: 78  Temp: 98 F (36.7 C)  TempSrc: Oral  SpO2: 96%  Weight: 128 lb 12.8 oz (58.4 kg)  Height: 5' (1.524 m)   Body mass index is 25.15 kg/m.  Physical Exam  Constitutional: She is oriented to person, place, and time. She appears well-developed and well-nourished. No distress.  HENT:  Head: Normocephalic and atraumatic.  Mouth/Throat: Oropharynx is clear and moist. No oropharyngeal exudate.  Eyes: Pupils are equal, round, and reactive to light. Conjunctivae are normal.  Neck: Normal range of motion. Neck supple.  Cardiovascular: Normal rate, regular rhythm and normal heart sounds.  Pulmonary/Chest: Effort normal and breath sounds normal.  Abdominal: Soft. Bowel sounds are normal.  Musculoskeletal: She exhibits no edema or tenderness.  Neurological: She is alert and oriented to person, place, and time.  Skin: Skin is warm and dry. She is not diaphoretic.  Psychiatric: She has a normal mood and affect.    Labs reviewed: Basic Metabolic Panel: Recent Labs    11/27/16 12/12/16 1514  NA 138 139  K 4.2 4.4  CL  --  104  CO2  --  29  GLUCOSE  --  101*  BUN 10 15  CREATININE 0.8 0.78  CALCIUM  --  9.8   Liver Function Tests: Recent Labs    11/27/16  AST 22  ALT 18  ALKPHOS 47   No results for input(s): LIPASE, AMYLASE in the last 8760 hours. No results for input(s): AMMONIA in the last 8760 hours. CBC: Recent Labs    11/27/16 12/12/16 1514  WBC 4.2 6.5  HGB 11.5* 11.6*  HCT 35* 35.0*  MCV  --  94.7  PLT 311 492.0*   Lipid Panel: No results for input(s): CHOL, HDL, LDLCALC, TRIG, CHOLHDL, LDLDIRECT in the last 8760 hours. TSH: No results for input(s): TSH in the last 8760 hours. A1C: No results found for: HGBA1C   Assessment/Plan 1. Memory loss -aware of some memory loss, will get MMSE on next OV. Will get lab work as well.  - CMP; Future - CBC with Differential/Platelets; Future - Lipid Panel;  Future - TSH; Future  2. Osteoporosis, unspecified osteoporosis type, unspecified pathological fracture presence -previously on bisphosphate in the past, will follow up dexa, continues on cal and vit d, encouraged weight bearing exercise.  - DG Bone Density; Future  3. Hyperlipidemia, unspecified hyperlipidemia type Off statin, would like to stay off statin and follow up cholesterol.  - CMP; Future - Lipid Panel; Future  4. Carotid artery disease, unspecified laterality, unspecified type (HCC) Mild carotid plaque noted on 2017 lifeline screening, will need follow up US for evaluation   5. Anemia, unspecified type -will follow up cbc with next labs, continues on iron  supplement   Next appt: 12/10/2017 Vanessa Chavez. Biagio Borg  [facility] [phone]  a

## 2017-12-09 ENCOUNTER — Other Ambulatory Visit: Payer: Self-pay

## 2017-12-09 DIAGNOSIS — R413 Other amnesia: Secondary | ICD-10-CM

## 2017-12-09 DIAGNOSIS — E785 Hyperlipidemia, unspecified: Secondary | ICD-10-CM

## 2017-12-10 ENCOUNTER — Other Ambulatory Visit: Payer: Medicare HMO

## 2017-12-16 ENCOUNTER — Ambulatory Visit (INDEPENDENT_AMBULATORY_CARE_PROVIDER_SITE_OTHER): Payer: Medicare HMO

## 2017-12-16 VITALS — BP 128/72 | HR 99 | Temp 98.4°F | Ht 60.0 in | Wt 127.0 lb

## 2017-12-16 DIAGNOSIS — Z0000 Encounter for general adult medical examination without abnormal findings: Secondary | ICD-10-CM | POA: Diagnosis not present

## 2017-12-16 MED ORDER — ZOSTER VAC RECOMB ADJUVANTED 50 MCG/0.5ML IM SUSR: 0.5000 mL | Freq: Once | INTRAMUSCULAR | 1 refills | Status: AC

## 2017-12-16 MED ORDER — SIMVASTATIN 20 MG PO TABS: 20.0000 mg | ORAL_TABLET | Freq: Every day | ORAL | 0 refills | Status: DC

## 2017-12-16 NOTE — Progress Notes (Signed)
Subjective:   Vanessa Chavez is a 77 y.o. female who presents for Medicare Annual (Subsequent) preventive examination.  Last AWV-08/28/2016       Objective:     Vitals: BP 128/72 (BP Location: Left Arm, Patient Position: Sitting)   Pulse 99   Temp 98.4 F (36.9 C) (Oral)   Ht 5' (1.524 m)   Wt 127 lb (57.6 kg)   SpO2 96%   BMI 24.80 kg/m   Body mass index is 24.8 kg/m.  Advanced Directives 12/16/2017 11/12/2017 12/14/2016  Does Patient Have a Medical Advance Directive? Yes Yes No  Type of Estate agent of [place];Living will Healthcare Power of [place];Living will -  Does patient want to make changes to medical advance directive? No - Patient declined - -  Copy of Healthcare Power of Attorney in Chart? No - copy requested No - copy requested -    Tobacco Social History   Tobacco Use  Smoking Status Former Smoker  . Packs/day: 1.50  . Years: 40.00  . Pack years: 60.00  . Last attempt to quit: 2008  . 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 CAD (coronary artery disease)   . Collagenous colitis   . Female bladder prolapse   . Herpes zoster   . Hyperlipidemia   . Normocytic anemia   . Osteoarthritis   . Osteopenia   . Right knee meniscal tear   . Senile purpura (HCC)    Past Surgical History:  Procedure Laterality Date  . ABDOMINAL HYSTERECTOMY    . BREAST EXCISIONAL BIOPSY    . CATARACT EXTRACTION Right 04/2017  . CATARACT EXTRACTION Left  05/2017   Family History  Problem Relation Age of Onset  . Pneumonia Mother 55  . Hypertension Mother   . Cirrhosis Father 51       liver  . Cancer - Lung Father   . Cancer Brother        sinuses  . Suicidality Son   . Heart Problems Maternal Grandmother 59   Social History   Socioeconomic History  . Marital status: Single    Spouse name: Not on file  . Number of children: Not on file  . Years of education: Not on file  . Highest education level: Not on file  Occupational History  . Not on file  Social Needs  . Financial resource strain: Not hard at all  . Food insecurity:    Worry: Never true    Inability: Never true  . Transportation needs:    Medical: No    Non-medical: No  Tobacco Use  . Smoking status: Former Smoker    Packs/day: 1.50    Years: 40.00    Pack years: 60.00    Last attempt to quit: 2008    Years since quitting: 11.8  . Smokeless tobacco: Never Used  Substance and Sexual Activity  . Alcohol use: Yes    Frequency: Never    Comment: occasional  .  Drug use: No  . Sexual activity: Not Currently  Lifestyle  . Physical activity:    Days per week: 7 days    Minutes per session: 20 min  . Stress: Not at all  Relationships  . Social connections:    Talks on phone: Once a week    Gets together: Once a week    Attends religious service: Never    Active member of club or organization: No    Attends meetings of clubs or organizations: Never    Relationship status: Never married  Other Topics Concern  . Not on file  Social History Narrative   Social History      Diet?       Do you drink/eat things with caffeine? yes      Marital status?               D 1986                     What year were you married? 1963      Do you live in a house, apartment, assisted living, condo, trailer, etc.? Townhouse      Is it one or more stories? Single level      How many persons live in your home? Only me      Do you have any pets in your home? (please  list)      Highest level of education completed? RN      Current or past profession: Psychiatrist in [place]      Do you exercise?           Go to gym                           Type & how often? Walk on treadmill. Walk outside when weather permits      Advanced Directives      Do you have a living will? yes      Do you have a DNR form?            yes                      If not, do you want to discuss one? no      Do you have signed POA/HPOA for forms? yes      Functional Status      Do you have difficulty bathing or dressing yourself? no      Do you have difficulty preparing food or eating? no      Do you have difficulty managing your medications? no      Do you have difficulty managing your finances? no      Do you have difficulty affording your medications? no    Outpatient Encounter Medications as of 12/16/2017  Medication Sig  . Ascorbic Acid (VITAMIN C) 1000 MG tablet Take 1,000 mg daily by mouth.  . B Complex-C-Folic Acid (STRESS B COMPLEX PO) Take by mouth.  . calcium carbonate (OS-CAL - DOSED IN MG OF ELEMENTAL CALCIUM) 1250 (500 Ca) MG tablet Take 1 tablet by mouth.  . cholecalciferol (VITAMIN D) 1000 units tablet Take 1,000 Units 2 (two) times daily by mouth.  . Garlic 1000 MG CAPS Take by mouth.  . Multiple Vitamin (MULTIVITAMIN) capsule Take 1 capsule daily by mouth.  . simvastatin (ZOCOR) 20 MG tablet Take 1 tablet (20 mg total) by mouth daily.  Marland Kitchen Zoster  Vaccine Adjuvanted [facility]) injection Inject 0.5 mLs into the muscle once for 1 dose.  . [DISCONTINUED] simvastatin (ZOCOR) 20 MG tablet Take 20 mg by mouth daily.  . [DISCONTINUED] Zoster Vaccine Adjuvanted [facility]) injection Inject 0.5 mLs into the muscle once.   No facility-administered encounter medications on file as of 12/16/2017.     Activities of Daily Living In your present state of health, do you have any difficulty performing the following activities: 12/16/2017  Hearing? N  Vision? N    Difficulty concentrating or making decisions? Y  Walking or climbing stairs? N  Dressing or bathing? N  Doing errands, shopping? N  Preparing Food and eating ? N  Using the Toilet? N  In the past six months, have you accidently leaked urine? Y  Do you have problems with loss of bowel control? N  Managing your Medications? N  Managing your Finances? N  Housekeeping or managing your Housekeeping? N  Some recent data might be hidden    Patient Care Team: Kirt Boys, DO as PCP - General (Internal Medicine) Armbruster, Willaim Rayas, MD as Consulting Physician (Gastroenterology) Loletha Carrow, MD as Consulting Physician (Ophthalmology)    Assessment:   This is a routine wellness examination for Vanessa Chavez.  Exercise Activities and Dietary recommendations Current Exercise Habits: Home exercise routine, Type of exercise: walking, Time (Minutes): 20, Frequency (Times/Week): 7, Weekly Exercise (Minutes/Week): 140, Exercise limited by: None identified  Goals   None     Fall Risk Fall Risk  12/16/2017 11/12/2017  Falls in the past year? 0 No  Number falls in past yr: 0 -  Injury with Fall? 0 -   Is the patient's home free of loose throw rugs in walkways, pet beds, electrical cords, etc?   yes      Grab bars in the bathroom? yes      Handrails on the stairs?   yes      Adequate lighting?   yes  Depression Screen PHQ 2/9 Scores 12/16/2017 11/12/2017  PHQ - 2 Score 1 0     Cognitive Function MMSE - Mini Mental State Exam 12/16/2017  Orientation to time 5  Orientation to Place 5  Registration 3  Attention/ Calculation 5  Recall 2  Language- name 2 objects 2  Language- repeat 1  Language- follow 3 step command 3  Language- read & follow direction 1  Write a sentence 1  Copy design 1  Total score 29        Immunization History  Administered Date(s) Administered  . Influenza, High Dose Seasonal PF 11/22/2016, 10/31/2017  . Influenza-Unspecified 11/21/2010, 12/11/2011,  12/11/2014, 10/29/2015  . Pneumococcal Conjugate-13 08/24/2015  . Pneumococcal Polysaccharide-23 05/07/2006  . Tetanus 02/05/2001, 07/17/2011  . Zoster 10/04/2005    Qualifies for Shingles Vaccine? Due, ordered to pharmacy  Screening Tests Health Maintenance  Topic Date Due  . DEXA SCAN  11/05/2003  . TETANUS/TDAP  07/16/2021  . INFLUENZA VACCINE  Completed  . PNA vac Low Risk Adult  Completed    Cancer Screenings: Lung: Low Dose CT Chest recommended if Age 7-80 years, 30 pack-year currently smoking OR have quit w/in 15years. Patient does not qualify. Breast:  Up to date on Mammogram? Yes   Up to date of Bone Density/Dexa? No. Scheduled for December Colorectal: up to date  Additional Screenings:  Hepatitis C Screening: declined     Plan:    I have personally reviewed and addressed the Medicare Annual Wellness questionnaire and have noted the following in  the patient's chart:  A. Medical and social history B. Use of alcohol, tobacco or illicit drugs  C. Current medications and supplements D. Functional ability and status E.  Nutritional status F.  Physical activity G. Advance directives H. List of other physicians I.  Hospitalizations, surgeries, and ER visits in previous 12 months J.  Vitals K. Screenings to include hearing, vision, cognitive, depression L. Referrals and appointments - none  In addition, I have reviewed and discussed with patient certain preventive protocols, quality metrics, and best practice recommendations. A written personalized care plan for preventive services as well as general preventive health recommendations were provided to patient.  See attached scanned questionnaire for additional information.   Signed,   Tyron Russell, RN Nurse Health Advisor  Patient Concerns: Loose stools for the last couple years but it has gotten worse. Lightheadedness when she stands up for the last year.

## 2017-12-16 NOTE — Patient Instructions (Signed)
Vanessa Chavez , Thank you for taking time to come for your Medicare Wellness Visit. I appreciate your ongoing commitment to your health goals. Please review the following plan we discussed and let me know if I can assist you in the future.   Screening recommendations/referrals: Colonoscopy excluded, over age 79 Mammogram excluded, over age 90 Bone Density due, scheduled for December Recommended yearly ophthalmology/optometry visit for glaucoma screening and checkup Recommended yearly dental visit for hygiene and checkup  Vaccinations: Influenza vaccine up to date Pneumococcal vaccine up to date, completed Tdap vaccine up to date, due 07/16/2021 Shingles vaccine due, ordered to pharmacy    Advanced directives: Please bring [place] a copy of your living will and health care power of attorney  Conditions/risks identified: none  Next appointment: Tyron Russell, RN 12/19/2018 @ 9:30am   Preventive Care 65 Years and Older, Female Preventive care refers to lifestyle choices and visits with your health care provider that can promote health and wellness. What does preventive care include?  A yearly physical exam. This is also called an annual well check.  Dental exams once or twice a year.  Routine eye exams. Ask your health care provider how often you should have your eyes checked.  Personal lifestyle choices, including:  Daily care of your teeth and gums.  Regular physical activity.  Eating a healthy diet.  Avoiding tobacco and drug use.  Limiting alcohol use.  Practicing safe sex.  Taking low-dose aspirin every day.  Taking vitamin and mineral supplements as recommended by your health care provider. What happens during an annual well check? The services and screenings done by your health care provider during your annual well check will depend on your age, overall health, lifestyle risk factors, and family history of disease. Counseling  Your health care provider may ask you  questions about your:  Alcohol use.  Tobacco use.  Drug use.  Emotional well-being.  Home and relationship well-being.  Sexual activity.  Eating habits.  History of falls.  Memory and ability to understand (cognition).  Work and work Astronomer.  Reproductive health. Screening  You may have the following tests or measurements:  Height, weight, and BMI.  Blood pressure.  Lipid and cholesterol levels. These may be checked every 5 years, or more frequently if you are over 58 years old.  Skin check.  Lung cancer screening. You may have this screening every year starting at age 52 if you have a 30-pack-year history of smoking and currently smoke or have quit within the past 15 years.  Fecal occult blood test (FOBT) of the stool. You may have this test every year starting at age 18.  Flexible sigmoidoscopy or colonoscopy. You may have a sigmoidoscopy every 5 years or a colonoscopy every 10 years starting at age 75.  Hepatitis C blood test.  Hepatitis B blood test.  Sexually transmitted disease (STD) testing.  Diabetes screening. This is done by checking your blood sugar (glucose) after you have not eaten for a while (fasting). You may have this done every 1-3 years.  Bone density scan. This is done to screen for osteoporosis. You may have this done starting at age 22.  Mammogram. This may be done every 1-2 years. Talk to your health care provider about how often you should have regular mammograms. Talk with your health care provider about your test results, treatment options, and if necessary, the need for more tests. Vaccines  Your health care provider may recommend certain vaccines, such as:  Influenza vaccine.  This is recommended every year.  Tetanus, diphtheria, and acellular pertussis (Tdap, Td) vaccine. You may need a Td booster every 10 years.  Zoster vaccine. You may need this after age 50.  Pneumococcal 13-valent conjugate (PCV13) vaccine. One dose is  recommended after age 79.  Pneumococcal polysaccharide (PPSV23) vaccine. One dose is recommended after age 75. Talk to your health care provider about which screenings and vaccines you need and how often you need them. This information is not intended to replace advice given to you by your health care provider. Make sure you discuss any questions you have with your health care provider. Document Released: 02/18/2015 Document Revised: 10/12/2015 Document Reviewed: 11/23/2014 Elsevier Interactive Patient Education  2017 [employer] Prevention in the Home Falls can cause injuries. They can happen to people of all ages. There are many things you can do to make your home safe and to help prevent falls. What can I do on the outside of my home?  Regularly fix the edges of walkways and driveways and fix any cracks.  Remove anything that might make you trip as you walk through a door, such as a raised step or threshold.  Trim any bushes or trees on the path to your home.  Use bright outdoor lighting.  Clear any walking paths of anything that might make someone trip, such as rocks or tools.  Regularly check to see if handrails are loose or broken. Make sure that both sides of any steps have handrails.  Any raised decks and porches should have guardrails on the edges.  Have any leaves, snow, or ice cleared regularly.  Use sand or salt on walking paths during winter.  Clean up any spills in your garage right away. This includes oil or grease spills. What can I do in the bathroom?  Use night lights.  Install grab bars by the toilet and in the tub and shower. Do not use towel bars as grab bars.  Use non-skid mats or decals in the tub or shower.  If you need to sit down in the shower, use a plastic, non-slip stool.  Keep the floor dry. Clean up any water that spills on the floor as soon as it happens.  Remove soap buildup in the tub or shower regularly.  Attach bath mats  securely with double-sided non-slip rug tape.  Do not have throw rugs and other things on the floor that can make you trip. What can I do in the bedroom?  Use night lights.  Make sure that you have a light by your bed that is easy to reach.  Do not use any sheets or blankets that are too big for your bed. They should not hang down onto the floor.  Have a firm chair that has side arms. You can use this for support while you get dressed.  Do not have throw rugs and other things on the floor that can make you trip. What can I do in the kitchen?  Clean up any spills right away.  Avoid walking on wet floors.  Keep items that you use a lot in easy-to-reach places.  If you need to reach something above you, use a strong step stool that has a grab bar.  Keep electrical cords out of the way.  Do not use floor polish or wax that makes floors slippery. If you must use wax, use non-skid floor wax.  Do not have throw rugs and other things on the floor that can make  you trip. What can I do with my stairs?  Do not leave any items on the stairs.  Make sure that there are handrails on both sides of the stairs and use them. Fix handrails that are broken or loose. Make sure that handrails are as long as the stairways.  Check any carpeting to make sure that it is firmly attached to the stairs. Fix any carpet that is loose or worn.  Avoid having throw rugs at the top or bottom of the stairs. If you do have throw rugs, attach them to the floor with carpet tape.  Make sure that you have a light switch at the top of the stairs and the bottom of the stairs. If you do not have them, ask someone to add them for you. What else can I do to help prevent falls?  Wear shoes that:  Do not have high heels.  Have rubber bottoms.  Are comfortable and fit you well.  Are closed at the toe. Do not wear sandals.  If you use a stepladder:  Make sure that it is fully opened. Do not climb a closed  stepladder.  Make sure that both sides of the stepladder are locked into place.  Ask someone to hold it for you, if possible.  Clearly mark and make sure that you can see:  Any grab bars or handrails.  First and last steps.  Where the edge of each step is.  Use tools that help you move around (mobility aids) if they are needed. These include:  Canes.  Walkers.  Scooters.  Crutches.  Turn on the lights when you go into a dark area. Replace any light bulbs as soon as they burn out.  Set up your furniture so you have a clear path. Avoid moving your furniture around.  If any of your floors are uneven, fix them.  If there are any pets around you, be aware of where they are.  Review your medicines with your doctor. Some medicines can make you feel dizzy. This can increase your chance of falling. Ask your doctor what other things that you can do to help prevent falls. This information is not intended to replace advice given to you by your health care provider. Make sure you discuss any questions you have with your health care provider. Document Released: 11/18/2008 Document Revised: 06/30/2015 Document Reviewed: 02/26/2014 Elsevier Interactive Patient Education  2017 [employer].

## 2017-12-17 LAB — CBC WITH DIFFERENTIAL/PLATELET
BASOS PCT: 1.4 %
Basophils Absolute: 62 cells/uL (ref 0–200)
Eosinophils Absolute: 334 cells/uL (ref 15–500)
Eosinophils Relative: 7.6 %
HCT: 39.2 % (ref 35.0–45.0)
Hemoglobin: 13.5 g/dL (ref 11.7–15.5)
LYMPHS ABS: 1417 {cells}/uL (ref 850–3900)
MCH: 32 pg (ref 27.0–33.0)
MCHC: 34.4 g/dL (ref 32.0–36.0)
MCV: 92.9 fL (ref 80.0–100.0)
MPV: 9.6 fL (ref 7.5–12.5)
Monocytes Relative: 10.1 %
NEUTROS ABS: 2143 {cells}/uL (ref 1500–7800)
Neutrophils Relative %: 48.7 %
PLATELETS: 251 10*3/uL (ref 140–400)
RBC: 4.22 10*6/uL (ref 3.80–5.10)
RDW: 12.7 % (ref 11.0–15.0)
Total Lymphocyte: 32.2 %
WBC: 4.4 10*3/uL (ref 3.8–10.8)
WBCMIX: 444 {cells}/uL (ref 200–950)

## 2017-12-17 LAB — COMPREHENSIVE METABOLIC PANEL
AG RATIO: 2 (calc) (ref 1.0–2.5)
ALT: 10 U/L (ref 6–29)
AST: 15 U/L (ref 10–35)
Albumin: 4.5 g/dL (ref 3.6–5.1)
Alkaline phosphatase (APISO): 50 U/L (ref 33–130)
BILIRUBIN TOTAL: 0.6 mg/dL (ref 0.2–1.2)
BUN: 15 mg/dL (ref 7–25)
CALCIUM: 9.9 mg/dL (ref 8.6–10.4)
CO2: 30 mmol/L (ref 20–32)
Chloride: 105 mmol/L (ref 98–110)
Creat: 0.8 mg/dL (ref 0.60–0.93)
Globulin: 2.2 g/dL (calc) (ref 1.9–3.7)
Glucose, Bld: 85 mg/dL (ref 65–99)
Potassium: 4.6 mmol/L (ref 3.5–5.3)
SODIUM: 142 mmol/L (ref 135–146)
TOTAL PROTEIN: 6.7 g/dL (ref 6.1–8.1)

## 2017-12-17 LAB — LIPID PANEL
CHOL/HDL RATIO: 4.7 (calc) (ref <5.0)
CHOLESTEROL: 225 mg/dL — AB (ref <200)
HDL: 48 mg/dL — ABNORMAL LOW (ref >50)
LDL CHOLESTEROL (CALC): 139 mg/dL — AB
Non-HDL Cholesterol (Calc): 177 mg/dL (calc) — ABNORMAL HIGH (ref <130)
TRIGLYCERIDES: 233 mg/dL — AB (ref <150)

## 2017-12-17 LAB — RPR: RPR Ser Ql: NONREACTIVE

## 2017-12-17 LAB — TSH: TSH: 3.65 mIU/L (ref 0.40–4.50)

## 2017-12-18 ENCOUNTER — Other Ambulatory Visit: Payer: Self-pay

## 2017-12-18 DIAGNOSIS — E785 Hyperlipidemia, unspecified: Secondary | ICD-10-CM

## 2017-12-19 ENCOUNTER — Telehealth: Payer: Self-pay | Admitting: *Deleted

## 2017-12-19 NOTE — Telephone Encounter (Signed)
Patient called and stated that she received a call from [facility] that she had a Rx for Flexeril ready for pick up. Patient stated that she does not take Flexeril and called wanting to make sure we didn't have it in her medication list or prescribed it. I informed her that it was not in her current medication list and we did not prescribe. She stated she was going to call that office and get it straight. Stated that she sees us now and not them and does not need any Flexeril.

## 2017-12-23 ENCOUNTER — Encounter: Payer: Medicare HMO | Admitting: Nurse Practitioner

## 2017-12-25 ENCOUNTER — Ambulatory Visit (INDEPENDENT_AMBULATORY_CARE_PROVIDER_SITE_OTHER): Payer: Medicare HMO | Admitting: Nurse Practitioner

## 2017-12-25 ENCOUNTER — Encounter: Payer: Self-pay | Admitting: Nurse Practitioner

## 2017-12-25 VITALS — BP 118/62 | HR 73 | Temp 97.8°F | Ht 60.0 in | Wt 129.2 lb

## 2017-12-25 DIAGNOSIS — K591 Functional diarrhea: Secondary | ICD-10-CM

## 2017-12-25 DIAGNOSIS — E785 Hyperlipidemia, unspecified: Secondary | ICD-10-CM | POA: Diagnosis not present

## 2017-12-25 DIAGNOSIS — R413 Other amnesia: Secondary | ICD-10-CM

## 2017-12-25 DIAGNOSIS — M810 Age-related osteoporosis without current pathological fracture: Secondary | ICD-10-CM | POA: Diagnosis not present

## 2017-12-25 DIAGNOSIS — Z7189 Other specified counseling: Secondary | ICD-10-CM | POA: Diagnosis not present

## 2017-12-25 DIAGNOSIS — Z0000 Encounter for general adult medical examination without abnormal findings: Secondary | ICD-10-CM

## 2017-12-25 DIAGNOSIS — D649 Anemia, unspecified: Secondary | ICD-10-CM

## 2017-12-25 NOTE — Progress Notes (Signed)
Provider: Sharon Seller, NP  Patient Care Team: Sharon Seller, NP as PCP - General (Geriatric Medicine) Armbruster, Willaim Rayas, MD as Consulting Physician (Gastroenterology) Loletha Carrow, MD as Consulting Physician (Ophthalmology)  Extended Emergency Contact Information Primary Emergency Contact: Record,Nicole Address: [address]          [place], [place] [address] [place] of [place] Home Phone: [phone] Work Phone: [phone] Mobile Phone: [phone] Relation: Daughter No Known Allergies Code Status: DNR Goals of Care: Advanced Directive information Advanced Directives 12/25/2017  Does Patient Have a Medical Advance Directive? Yes  Type of Advance Directive -  Does patient want to make changes to medical advance directive? No - Patient declined  Copy of Healthcare Power of Attorney in Chart? No - copy requested     Chief Complaint  Patient presents with  . Annual Exam    Yearly check-up, AWV completed on 12/16/17     HPI: Patient is a 77 y.o. female seen in today for an annual wellness exam.   Major illnesses or hospitalization in the last year -none  Depression screen [facility] 2/9 12/25/2017 12/16/2017 11/12/2017  Decreased Interest 0 0 0  Down, Depressed, Hopeless 1 1 0  PHQ - 2 Score 1 1 0    Fall Risk  12/25/2017 12/25/2017 12/16/2017 11/12/2017  Falls in the past year? 0 0 0 No  Number falls in past yr: 0 0 0 -  Injury with Fall? 0 0 0 -   MMSE - Mini Mental State Exam 12/16/2017  Orientation to time 5  Orientation to Place 5  Registration 3  Attention/ Calculation 5  Recall 2  Language- name 2 objects 2  Language- repeat 1  Language- follow 3 step command 3  Language- read & follow direction 1  Write a sentence 1  Copy design 1  Total score 29     Health Maintenance  Topic Date Due  . DEXA SCAN  11/05/2003  . TETANUS/TDAP  07/16/2021  . INFLUENZA VACCINE  Completed  . PNA vac Low Risk Adult  Completed   Ophthalmology-  yearly   Dentition: every 6 months.   Pain:none.   Bone density is scheduled.  mammogram done in June.   Restarted her statin- LDL elevated at 139.   Reports she is predicting she will be dead in the next 2 months, "im okay with it"  No SI or HI. It will come naturally.  Has advance directive but did not bring them.  Request DNR.  Very independent.   Past Medical History:  Diagnosis Date  . CAD (coronary artery disease)   . Collagenous colitis   . Female bladder prolapse   . Herpes zoster   . Hyperlipidemia   . Normocytic anemia   . Osteoarthritis   . Osteopenia   . Right knee meniscal tear   . Senile purpura (HCC)     Past Surgical History:  Procedure Laterality Date  . ABDOMINAL HYSTERECTOMY    . BREAST EXCISIONAL BIOPSY    . CATARACT EXTRACTION Right 04/2017  . CATARACT EXTRACTION Left 05/2017    Social History   Socioeconomic History  . Marital status: Single    Spouse name: Not on file  . Number of children: Not on file  . Years of education: Not on file  . Highest education level: Not on file  Occupational History  . Not on file  Social Needs  . Financial resource strain: Not hard at all  . Food insecurity:  Worry: Never true    Inability: Never true  . Transportation needs:    Medical: No    Non-medical: No  Tobacco Use  . Smoking status: Former Smoker    Packs/day: 1.50    Years: 40.00    Pack years: 60.00    Last attempt to quit: 2008    Years since quitting: 11.8  . Smokeless tobacco: Never Used  Substance and Sexual Activity  . Alcohol use: Yes    Frequency: Never    Comment: occasional  . Drug use: No  . Sexual activity: Not Currently  Lifestyle  . Physical activity:    Days per week: 7 days    Minutes per session: 20 min  . Stress: Not at all  Relationships  . Social connections:    Talks on phone: Once a week    Gets together: Once a week    Attends religious service: Never    Active member of club or organization: No     Attends meetings of clubs or organizations: Never    Relationship status: Never married  Other Topics Concern  . Not on file  Social History Narrative   Social History      Diet?       Do you drink/eat things with caffeine? yes      Marital status?               D 1986                     What year were you married? 1963      Do you live in a house, apartment, assisted living, condo, trailer, etc.? Townhouse      Is it one or more stories? Single level      How many persons live in your home? Only me      Do you have any pets in your home? (please list)      Highest level of education completed? RN      Current or past profession: Psychiatrist in [place]      Do you exercise?           Go to gym                           Type & how often? Walk on treadmill. Walk outside when weather permits      Advanced Directives      Do you have a living will? yes      Do you have a DNR form?            yes                      If not, do you want to discuss one? no      Do you have signed POA/HPOA for forms? yes      Functional Status      Do you have difficulty bathing or dressing yourself? no      Do you have difficulty preparing food or eating? no      Do you have difficulty managing your medications? no      Do you have difficulty managing your finances? no      Do you have difficulty affording your medications? no    Family History  Problem Relation Age of Onset  . Pneumonia Mother 46  . Hypertension Mother   . Cirrhosis Father 1  liver  . Cancer - Lung Father   . Cancer Brother        sinuses  . Suicidality Son   . Heart Problems Maternal Grandmother 47    Review of Systems:  Review of Systems  Constitutional: Negative for activity change, appetite change, fatigue and unexpected weight change.  HENT: Negative for congestion and hearing loss.   Eyes: Negative.   Respiratory: Negative for cough and shortness of breath.   Cardiovascular: Negative  for chest pain, palpitations and leg swelling.  Gastrointestinal: Negative for abdominal pain, constipation and diarrhea.  Genitourinary: Negative for difficulty urinating and dysuria.  Musculoskeletal: Negative for arthralgias and myalgias.  Skin: Negative for color change and wound.  Neurological: Negative for dizziness and weakness.  Psychiatric/Behavioral: Negative for agitation, behavioral problems and confusion.     Allergies as of 12/25/2017   No Known Allergies     Medication List        Accurate as of 12/25/17  1:52 PM. Always use your most recent med list.          calcium carbonate 1250 (500 Ca) MG tablet Commonly known as:  OS-CAL - dosed in mg of elemental calcium Take 1 tablet by mouth.   cholecalciferol 1000 units tablet Commonly known as:  VITAMIN D Take 1,000 Units 2 (two) times daily by mouth.   Garlic 1000 MG Caps Take by mouth.   multivitamin capsule Take 1 capsule daily by mouth.   simvastatin 20 MG tablet Commonly known as:  ZOCOR Take 1 tablet (20 mg total) by mouth daily.   STRESS B COMPLEX PO Take by mouth.   vitamin C 1000 MG tablet Take 1,000 mg daily by mouth.         Physical Exam: Vitals:   12/25/17 1322  BP: 118/62  Pulse: 73  Temp: 97.8 F (36.6 C)  TempSrc: Oral  SpO2: 96%  Weight: 129 lb 3.2 oz (58.6 kg)  Height: 5' (1.524 m)   Body mass index is 25.23 kg/m. Physical Exam  Constitutional: She is oriented to person, place, and time. She appears well-developed and well-nourished. No distress.  HENT:  Head: Normocephalic and atraumatic.  Mouth/Throat: Oropharynx is clear and moist. No oropharyngeal exudate.  Eyes: Pupils are equal, round, and reactive to light. Conjunctivae are normal.  Neck: Normal range of motion. Neck supple.  Cardiovascular: Normal rate, regular rhythm and normal heart sounds.  Pulmonary/Chest: Effort normal and breath sounds normal.  Abdominal: Soft. Bowel sounds are normal.    Musculoskeletal: She exhibits no edema or tenderness.  Neurological: She is alert and oriented to person, place, and time.  Skin: Skin is warm and dry. She is not diaphoretic.  Psychiatric: She has a normal mood and affect.    Labs reviewed: Basic Metabolic Panel: Recent Labs    12/16/17 0914  NA 142  K 4.6  CL 105  CO2 30  GLUCOSE 85  BUN 15  CREATININE 0.80  CALCIUM 9.9  TSH 3.65   Liver Function Tests: Recent Labs    12/16/17 0914  AST 15  ALT 10  BILITOT 0.6  PROT 6.7   No results for input(s): LIPASE, AMYLASE in the last 8760 hours. No results for input(s): AMMONIA in the last 8760 hours. CBC: Recent Labs    12/16/17 0914  WBC 4.4  NEUTROABS 2,143  HGB 13.5  HCT 39.2  MCV 92.9  PLT 251   Lipid Panel: Recent Labs    12/16/17 0914  CHOL 225*  HDL 48*  LDLCALC 139*  TRIG 233*  CHOLHDL 4.7   No results found for: HGBA1C  Procedures: No results found.  Assessment/Plan 1. Hyperlipidemia, unspecified hyperlipidemia type -recently started statin back, encouraged dietary modification, will follow up in 3 months with fasting lipid and appt.  - EKG 12-Lead- without prior EKG to compare, poor r wave progression, NSR, rate 72 2. Advanced care planning/counseling discussion -to bring living will and HCPOA paperwork in office.  - DNR (Do Not Resuscitate)  3. Memory loss MMSE 29/30. Good recall during OV however feels like she has poor memory, labs unremarkable. Denies depression.   4. Osteoporosis, unspecified osteoporosis type, unspecified pathological fracture presence Has dexa scan scheduled. Continues on calcium and vit d  5. Anemia, unspecified type hgb in normal range.   6. Functional diarrhea Ongoing off and on, has been following with GI, recommended to take probiotic daily   7. Wellness exam - The patient was counseled regarding the appropriate use of alcohol, regular self-examination of the breasts on a monthly basis, prevention of  dental and periodontal disease, diet, regular sustained exercise for at least 30 minutes 5 times per week, routine screening interval for mammogram as recommended by the American Cancer Society and ACOG ,smoking cessation, tobacco use,  and recommended schedule for GI hemoccult testing, colonoscopy, cholesterol, thyroid and diabetes screening.  Next appt: 3 months with fasting lab prior  Kristinia Leavy K. Biagio Borg  [facility] [phone]

## 2017-12-25 NOTE — Patient Instructions (Addendum)
Try probiotic to help with loose stool.   To make appt with Gastroenteritis.   Health Maintenance, Female Adopting a healthy lifestyle and getting preventive care can go a long way to promote health and wellness. Talk with your health care provider about what schedule of regular examinations is right for you. This is a good chance for you to check in with your provider about disease prevention and staying healthy. In between checkups, there are plenty of things you can do on your own. Experts have done a lot of research about which lifestyle changes and preventive measures are most likely to keep you healthy. Ask your health care provider for more information. Weight and diet Eat a healthy diet  Be sure to include plenty of vegetables, fruits, low-fat dairy products, and lean protein.  Do not eat a lot of foods high in solid fats, added sugars, or salt.  Get regular exercise. This is one of the most important things you can do for your health. ? Most adults should exercise for at least 150 minutes each week. The exercise should increase your heart rate and make you sweat (moderate-intensity exercise). ? Most adults should also do strengthening exercises at least twice a week. This is in addition to the moderate-intensity exercise.  Maintain a healthy weight  Body mass index (BMI) is a measurement that can be used to identify possible weight problems. It estimates body fat based on height and weight. Your health care provider can help determine your BMI and help you achieve or maintain a healthy weight.  For females 18 years of age and older: ? A BMI below 18.5 is considered underweight. ? A BMI of 18.5 to 24.9 is normal. ? A BMI of 25 to 29.9 is considered overweight. ? A BMI of 30 and above is considered obese.  Watch levels of cholesterol and blood lipids  You should start having your blood tested for lipids and cholesterol at 79 years of age, then have this test every 5 years.  You  may need to have your cholesterol levels checked more often if: ? Your lipid or cholesterol levels are high. ? You are older than 79 years of age. ? You are at high risk for heart disease.  Cancer screening Lung Cancer  Lung cancer screening is recommended for adults 44-21 years old who are at high risk for lung cancer because of a history of smoking.  A yearly low-dose CT scan of the lungs is recommended for people who: ? Currently smoke. ? Have quit within the past 15 years. ? Have at least a 30-pack-year history of smoking. A pack year is smoking an average of one pack of cigarettes a day for 1 year.  Yearly screening should continue until it has been 15 years since you quit.  Yearly screening should stop if you develop a health problem that would prevent you from having lung cancer treatment.  Breast Cancer  Practice breast self-awareness. This means understanding how your breasts normally appear and feel.  It also means doing regular breast self-exams. Let your health care provider know about any changes, no matter how small.  If you are in your 20s or 30s, you should have a clinical breast exam (CBE) by a health care provider every 1-3 years as part of a regular health exam.  If you are 72 or older, have a CBE every year. Also consider having a breast X-ray (mammogram) every year.  If you have a family history of breast cancer,  talk to your health care provider about genetic screening.  If you are at high risk for breast cancer, talk to your health care provider about having an MRI and a mammogram every year.  Breast cancer gene (BRCA) assessment is recommended for women who have family members with BRCA-related cancers. BRCA-related cancers include: ? Breast. ? Ovarian. ? Tubal. ? Peritoneal cancers.  Results of the assessment will determine the need for genetic counseling and BRCA1 and BRCA2 testing.  Cervical Cancer Your health care provider may recommend that you  be screened regularly for cancer of the pelvic organs (ovaries, uterus, and vagina). This screening involves a pelvic examination, including checking for microscopic changes to the surface of your cervix (Pap test). You may be encouraged to have this screening done every 3 years, beginning at age 42.  For women ages 28-65, health care providers may recommend pelvic exams and Pap testing every 3 years, or they may recommend the Pap and pelvic exam, combined with testing for human papilloma virus (HPV), every 5 years. Some types of HPV increase your risk of cervical cancer. Testing for HPV may also be done on women of any age with unclear Pap test results.  Other health care providers may not recommend any screening for nonpregnant women who are considered low risk for pelvic cancer and who do not have symptoms. Ask your health care provider if a screening pelvic exam is right for you.  If you have had past treatment for cervical cancer or a condition that could lead to cancer, you need Pap tests and screening for cancer for at least 20 years after your treatment. If Pap tests have been discontinued, your risk factors (such as having a new sexual partner) need to be reassessed to determine if screening should resume. Some women have medical problems that increase the chance of getting cervical cancer. In these cases, your health care provider may recommend more frequent screening and Pap tests.  Colorectal Cancer  This type of cancer can be detected and often prevented.  Routine colorectal cancer screening usually begins at 79 years of age and continues through 79 years of age.  Your health care provider may recommend screening at an earlier age if you have risk factors for colon cancer.  Your health care provider may also recommend using home test kits to check for hidden blood in the stool.  A small camera at the end of a tube can be used to examine your colon directly (sigmoidoscopy or  colonoscopy). This is done to check for the earliest forms of colorectal cancer.  Routine screening usually begins at age 56.  Direct examination of the colon should be repeated every 5-10 years through 79 years of age. However, you may need to be screened more often if early forms of precancerous polyps or small growths are found.  Skin Cancer  Check your skin from head to toe regularly.  Tell your health care provider about any new moles or changes in moles, especially if there is a change in a mole's shape or color.  Also tell your health care provider if you have a mole that is larger than the size of a pencil eraser.  Always use sunscreen. Apply sunscreen liberally and repeatedly throughout the day.  Protect yourself by wearing long sleeves, pants, a wide-brimmed hat, and sunglasses whenever you are outside.  Heart disease, diabetes, and high blood pressure  High blood pressure causes heart disease and increases the risk of stroke. High blood pressure is  more likely to develop in: ? People who have blood pressure in the high end of the normal range (130-139/85-89 mm Hg). ? People who are overweight or obese. ? People who are African American.  If you are 59-72 years of age, have your blood pressure checked every 3-5 years. If you are 93 years of age or older, have your blood pressure checked every year. You should have your blood pressure measured twice-once when you are at a hospital or clinic, and once when you are not at a hospital or clinic. Record the average of the two measurements. To check your blood pressure when you are not at a hospital or clinic, you can use: ? An automated blood pressure machine at a pharmacy. ? A home blood pressure monitor.  If you are between 28 years and 27 years old, ask your health care provider if you should take aspirin to prevent strokes.  Have regular diabetes screenings. This involves taking a blood sample to check your fasting blood sugar  level. ? If you are at a normal weight and have a low risk for diabetes, have this test once every three years after 79 years of age. ? If you are overweight and have a high risk for diabetes, consider being tested at a younger age or more often. Preventing infection Hepatitis B  If you have a higher risk for hepatitis B, you should be screened for this virus. You are considered at high risk for hepatitis B if: ? You were born in a country where hepatitis B is common. Ask your health care provider which countries are considered high risk. ? Your parents were born in a high-risk country, and you have not been immunized against hepatitis B (hepatitis B vaccine). ? You have HIV or AIDS. ? You use needles to inject street drugs. ? You live with someone who has hepatitis B. ? You have had sex with someone who has hepatitis B. ? You get hemodialysis treatment. ? You take certain medicines for conditions, including cancer, organ transplantation, and autoimmune conditions.  Hepatitis C  Blood testing is recommended for: ? Everyone born from [birth year] through 1967. ? Anyone with known risk factors for hepatitis C.  Sexually transmitted infections (STIs)  You should be screened for sexually transmitted infections (STIs) including gonorrhea and chlamydia if: ? You are sexually active and are younger than 79 years of age. ? You are older than 79 years of age and your health care provider tells you that you are at risk for this type of infection. ? Your sexual activity has changed since you were last screened and you are at an increased risk for chlamydia or gonorrhea. Ask your health care provider if you are at risk.  If you do not have HIV, but are at risk, it may be recommended that you take a prescription medicine daily to prevent HIV infection. This is called pre-exposure prophylaxis (PrEP). You are considered at risk if: ? You are sexually active and do not regularly use condoms or know the HIV  status of your partner(s). ? You take drugs by injection. ? You are sexually active with a partner who has HIV.  Talk with your health care provider about whether you are at high risk of being infected with HIV. If you choose to begin PrEP, you should first be tested for HIV. You should then be tested every 3 months for as long as you are taking PrEP. Pregnancy  If you are premenopausal and you  may become pregnant, ask your health care provider about preconception counseling.  If you may become pregnant, take 400 to 800 micrograms (mcg) of folic acid every day.  If you want to prevent pregnancy, talk to your health care provider about birth control (contraception). Osteoporosis and menopause  Osteoporosis is a disease in which the bones lose minerals and strength with aging. This can result in serious bone fractures. Your risk for osteoporosis can be identified using a bone density scan.  If you are 54 years of age or older, or if you are at risk for osteoporosis and fractures, ask your health care provider if you should be screened.  Ask your health care provider whether you should take a calcium or vitamin D supplement to lower your risk for osteoporosis.  Menopause may have certain physical symptoms and risks.  Hormone replacement therapy may reduce some of these symptoms and risks. Talk to your health care provider about whether hormone replacement therapy is right for you. Follow these instructions at home:  Schedule regular health, dental, and eye exams.  Stay current with your immunizations.  Do not use any tobacco products including cigarettes, chewing tobacco, or electronic cigarettes.  If you are pregnant, do not drink alcohol.  If you are breastfeeding, limit how much and how often you drink alcohol.  Limit alcohol intake to no more than 1 drink per day for nonpregnant women. One drink equals 12 ounces of beer, 5 ounces of wine, or 1 ounces of hard liquor.  Do not  use street drugs.  Do not share needles.  Ask your health care provider for help if you need support or information about quitting drugs.  Tell your health care provider if you often feel depressed.  Tell your health care provider if you have ever been abused or do not feel safe at home. This information is not intended to replace advice given to you by your health care provider. Make sure you discuss any questions you have with your health care provider. Document Released: 08/07/2010 Document Revised: 06/30/2015 Document Reviewed: 10/26/2014 Elsevier Interactive Patient Education  [employer].

## 2018-01-08 ENCOUNTER — Other Ambulatory Visit: Payer: Self-pay | Admitting: Nurse Practitioner

## 2018-01-14 ENCOUNTER — Other Ambulatory Visit: Payer: Medicare HMO

## 2018-02-07 ENCOUNTER — Ambulatory Visit
Admission: RE | Admit: 2018-02-07 | Discharge: 2018-02-07 | Disposition: A | Discharge status: Home / Self Care | Payer: PPO | Source: Ambulatory Visit | Attending: Nurse Practitioner | Admitting: Nurse Practitioner

## 2018-02-07 DIAGNOSIS — M85851 Other specified disorders of bone density and structure, right thigh: Secondary | ICD-10-CM | POA: Diagnosis not present

## 2018-02-07 DIAGNOSIS — M810 Age-related osteoporosis without current pathological fracture: Secondary | ICD-10-CM

## 2018-02-07 DIAGNOSIS — Z780 Asymptomatic menopausal state: Secondary | ICD-10-CM | POA: Diagnosis not present

## 2018-02-11 ENCOUNTER — Ambulatory Visit (INDEPENDENT_AMBULATORY_CARE_PROVIDER_SITE_OTHER): Payer: PPO | Admitting: Gastroenterology

## 2018-02-11 ENCOUNTER — Encounter: Payer: Self-pay | Admitting: Gastroenterology

## 2018-02-11 VITALS — BP 150/80 | HR 84 | Ht 63.0 in | Wt 131.0 lb

## 2018-02-11 DIAGNOSIS — K52831 Collagenous colitis: Secondary | ICD-10-CM

## 2018-02-11 NOTE — Progress Notes (Signed)
HPI :  78 year old female here for follow-up visit. I saw her in 2018 for severe diarrhea. 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 She was taking Aleve routinely at the time which was thought to be the most likely culprit. She has since stopped all NSAIDs. She was given a course of budesonide for her colitis and this resolved her symptoms completely, although she was very frustrated by the extreme cost of it.  Since her last seen her she has been using Imodium as needed. She may take Imodium once or twice a week and this generally keeps her symptoms fairly well controlled. She does continue to have loose stools intermittently, although not nearly as severe as previous and Imodium works fairly well. She has not had any fecal incontinence or weight loss. Not using NSAIDs. Of note she does continue to use simvastatin. She did hold this for a period of 6 weeks earlier in the year and states it did not change her symptoms at all.  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 CAD (coronary artery disease)   . Collagenous colitis   . Female bladder prolapse   . Herpes zoster   . Hyperlipidemia   . Normocytic anemia   . Osteoarthritis   . Osteopenia   . Right knee meniscal tear   . Senile purpura (HCC)      Past Surgical History:  Procedure Laterality Date  . ABDOMINAL HYSTERECTOMY    . BREAST EXCISIONAL BIOPSY    . CATARACT EXTRACTION Right 04/2017  . CATARACT EXTRACTION Left 05/2017   Family History  Problem Relation Age of Onset  . Pneumonia Mother 1186  . Hypertension Mother   . Cirrhosis Father [id number]       liver  . Cancer - Lung Father   . Cancer Brother        sinuses  . Suicidality Son   . Heart Problems Maternal Grandmother 1947   Social History   Tobacco Use  . Smoking status: Former Smoker    Packs/day: 1.50   Years: 40.00    Pack years: 60.00    Last attempt to quit: 2008    Years since quitting: 12.0  . Smokeless tobacco: Never Used  Substance Use Topics  . Alcohol use: Yes    Frequency: Never    Comment: occasional  . Drug use: No   Current Outpatient Medications  Medication Sig Dispense Refill  . Ascorbic Acid (VITAMIN C) 1000 MG tablet Take 1,000 mg daily by mouth.    . B Complex-C-Folic Acid (STRESS B COMPLEX PO) Take by mouth.    . calcium carbonate (OS-CAL - DOSED IN MG OF ELEMENTAL CALCIUM) 1250 (500 Ca) MG tablet Take 1 tablet by mouth.    . cholecalciferol (VITAMIN D) 1000 units tablet Take 1,000 Units 2 (two) times daily by mouth.    . Garlic 1000 MG CAPS Take by mouth.    . Multiple Vitamin (MULTIVITAMIN) capsule Take 1 capsule daily by mouth.    . simvastatin (ZOCOR) 20 MG tablet TAKE 1 TABLET BY MOUTH EVERY DAY 30 tablet 2   No current facility-administered medications for this visit.    No Known Allergies   Review of Systems: All systems reviewed and negative except where noted in HPI.   Lab Results  Component Value Date   WBC 4.4 12/16/2017   HGB 13.5 12/16/2017  HCT 39.2 12/16/2017   MCV 92.9 12/16/2017   PLT 251 12/16/2017    Lab Results  Component Value Date   CREATININE 0.80 12/16/2017   BUN 15 12/16/2017   NA 142 12/16/2017   K 4.6 12/16/2017   CL 105 12/16/2017   CO2 30 12/16/2017    Lab Results  Component Value Date   ALT 10 12/16/2017   AST 15 12/16/2017   ALKPHOS 47 11/27/2016   BILITOT 0.6 12/16/2017       Physical Exam: BP (!) 150/80   Pulse 84   Ht 5\' 3"  (1.6 m)   Wt 131 lb (59.4 kg)   BMI 23.21 kg/m  Constitutional: Pleasant,well-developed, female in no acute distress. HEENT: Normocephalic and atraumatic. Conjunctivae are normal. No scleral icterus. Neck supple.  Cardiovascular: Normal rate, regular rhythm.  Pulmonary/chest: Effort normal and breath sounds normal. No wheezing, rales or rhonchi. Abdominal: Soft,  nondistended, nontender.  There are no masses palpable. No hepatomegaly. Extremities: no edema Lymphadenopathy: No cervical adenopathy noted. Neurological: Alert and oriented to person place and time. Skin: Skin is warm and dry. No rashes noted. Psychiatric: Normal mood and affect. Behavior is normal.   ASSESSMENT AND PLAN: 78 year old female here for reassessment following issues:  Collagenous colitis - symptoms fairly well controlled with using Imodium as needed, symptoms relatively mild and has not progressed since her last seen. She unfortunately does not think she can afford budesonide given the cost of it should symptoms worsen in the future. Simvastatin is listed as an intermediate risk medication for the cause of microscopic colitis, and potentially that is the source of this, although she has held it in the past without significant benefit. She can discuss with her primary care she wishes to switch to another option (such as atorvastatin) which has not been associated with microscopic colitis. In the interim she will continue Imodium as needed, she can titrate up as needed. She can follow up as needed for this.   Ileene PatrickSteven Ambri Miltner, MD [facility]

## 2018-03-07 ENCOUNTER — Other Ambulatory Visit: Payer: Medicare HMO

## 2018-03-07 DIAGNOSIS — E785 Hyperlipidemia, unspecified: Secondary | ICD-10-CM | POA: Diagnosis not present

## 2018-03-07 LAB — LIPID PANEL
Cholesterol: 156 mg/dL (ref <200)
HDL: 59 mg/dL (ref >50)
LDL Cholesterol (Calc): 77 mg/dL (calc)
NON-HDL CHOLESTEROL (CALC): 97 mg/dL (ref <130)
Total CHOL/HDL Ratio: 2.6 (calc) (ref <5.0)
Triglycerides: 112 mg/dL (ref <150)

## 2018-03-14 ENCOUNTER — Telehealth: Payer: Self-pay

## 2018-03-14 NOTE — Telephone Encounter (Signed)
Patient called to confirm next appointment. I reviewed appointment desk and informed patient of pending appointment with Dr.Reed on 03/27/2018.  Patient asked if I was sure of the date and time that I provided for her, I replied yes. Patient then became irate and started yelling at what appeared to be the top of her lungs. Patient stated "Are you lying to me because if I get there and my appointment is not until April, I am going to knock somebody's head off." Patient then became very calm as she was in the beginning of conversation and stated she would like to speak directly with Shanda Bumps.  I became very confused and surprised at the same time not knowing exactly what was going on with the patient that made her switch back and fourth between personalities. I advised patient that I will send Shanda Bumps a message informing her of request to speak with her.

## 2018-03-17 NOTE — Telephone Encounter (Signed)
This patient was extremely irate and calling to see what she would like to speak to you about will cause this patient to become agitated that her request to speak with you directly was not honored.

## 2018-03-17 NOTE — Telephone Encounter (Signed)
Can you clarify on what she would like to speak with me about? She can come in for an appt if she has a concern that she would like to discuss directly with me

## 2018-03-20 NOTE — Telephone Encounter (Signed)
[place] maintenance (IT)(pratice administrator) called patient, left message. Patient never returned call

## 2018-03-24 ENCOUNTER — Other Ambulatory Visit: Payer: Medicare HMO

## 2018-03-25 ENCOUNTER — Other Ambulatory Visit: Payer: Self-pay | Admitting: *Deleted

## 2018-03-25 MED ORDER — SIMVASTATIN 20 MG PO TABS: 20.0000 mg | ORAL_TABLET | Freq: Every day | ORAL | 1 refills | Status: DC

## 2018-03-25 NOTE — Telephone Encounter (Signed)
Patient requested refill

## 2018-03-27 ENCOUNTER — Ambulatory Visit (INDEPENDENT_AMBULATORY_CARE_PROVIDER_SITE_OTHER): Payer: PPO | Admitting: Internal Medicine

## 2018-03-27 ENCOUNTER — Encounter: Payer: Self-pay | Admitting: Internal Medicine

## 2018-03-27 VITALS — BP 148/80 | HR 69 | Temp 98.4°F | Ht 63.0 in | Wt 134.0 lb

## 2018-03-27 DIAGNOSIS — E785 Hyperlipidemia, unspecified: Secondary | ICD-10-CM

## 2018-03-27 DIAGNOSIS — R413 Other amnesia: Secondary | ICD-10-CM | POA: Diagnosis not present

## 2018-03-27 DIAGNOSIS — K52831 Collagenous colitis: Secondary | ICD-10-CM | POA: Diagnosis not present

## 2018-03-27 DIAGNOSIS — I739 Peripheral vascular disease, unspecified: Secondary | ICD-10-CM

## 2018-03-27 DIAGNOSIS — M8589 Other specified disorders of bone density and structure, multiple sites: Secondary | ICD-10-CM | POA: Insufficient documentation

## 2018-03-27 DIAGNOSIS — I779 Disorder of arteries and arterioles, unspecified: Secondary | ICD-10-CM

## 2018-03-27 DIAGNOSIS — D649 Anemia, unspecified: Secondary | ICD-10-CM | POA: Diagnosis not present

## 2018-03-27 NOTE — Progress Notes (Signed)
Location:  [facility] clinic Provider:  Murvin Gift L. Renato Gails, D.O., C.M.D.  Code Status:  DNR Goals of Care:  Advanced Directives 12/25/2017  Does Patient Have a Medical Advance Directive? Yes  Type of Advance Directive -  Does patient want to make changes to medical advance directive? No - Patient declined  Copy of Healthcare Power of Attorney in Chart? No - copy requested     Chief Complaint  Patient presents with  . Medical Management of Chronic Issues    follow-up    HPI: Patient is a 78 y.o. female seen today for medical management of chronic diseases.    She was concerned about her bone density. Has not had any fractures.  Had osteopenia on the bone density. She is taking the 2000 units of Vitamin D daily.  She is a faller.  Thankfully no falls since Nov appt.  No dizziness or balance problems.  Just happens.  She was lonesome at home and got herself a cat.  He keeps her company.  She is not from here.  Her daughter, Joni Reining, lives here so she moved here.  She'd been in [facility].      Cholesterol shot up when she was off the medication--it's better again now.  She eats a healthy diet.  Did gain weight from sitting on her backside too much.  Went to the gym for a while, but then her arms started to bother her.  Might go back and walk.  They do not have a pool there.  She has some exercise tapes.  She used to work at [employer] and did chair exercises there.  She was a Engineer, civil (consulting).    She lives at [facility].  There's a cat that wanders around that belonged to someone else that left.  Cat disappeared now.    MMSE was 29/30.  She's started to take prevagen.  Takes a B complex.  Will forget what her daughter says to her minutes later.  Only keeps things that are really important.  No problems remembering pills/supplements.  Sometimes doesn't know what day it is b/c she doesn't have to work anymore.    Hgb Nov was normal.  She feels really tired.  Not weak, not sob.  Saw GI since her last visit.   Still gets loose stool.  Can't go out when that's going on.  Had a bad bout in the past that she thought would kill her.  She uses imodium if she's bothered now.  Had collagenous colitis.  Does not want another colonoscopy in her future.    Sees well.  Had her cataracts done.  Has new glasses with bifocals.  Has appt with Dr. Charlotte Sanes in April.    Has had carotid artery disease:  She's going to get the screening tests done and bring her report to [place].  It has been stable for 10 years.    Past Medical History:  Diagnosis Date  . CAD (coronary artery disease)   . Collagenous colitis   . Female bladder prolapse   . Herpes zoster   . Hyperlipidemia   . Normocytic anemia   . Osteoarthritis   . Osteopenia   . Right knee meniscal tear   . Senile purpura (HCC)     Past Surgical History:  Procedure Laterality Date  . ABDOMINAL HYSTERECTOMY    . BREAST EXCISIONAL BIOPSY    . CATARACT EXTRACTION Right 04/2017  . CATARACT EXTRACTION Left 05/2017    No Known Allergies  Outpatient Encounter  Medications as of 03/27/2018  Medication Sig  . Ascorbic Acid (VITAMIN C) 1000 MG tablet Take 1,000 mg daily by mouth.  . B Complex-C-Folic Acid (STRESS B COMPLEX PO) Take by mouth.  Marland Kitchen. CALCIUM PO Take by mouth. 1200 mg tablets , she takes 2 daily at one time  . Garlic 1000 MG CAPS Take by mouth.  . Multiple Vitamin (MULTIVITAMIN) capsule Take 1 capsule daily by mouth.  . simvastatin (ZOCOR) 20 MG tablet Take 1 tablet (20 mg total) by mouth daily.  Marland Kitchen. VITAMIN D PO Take by mouth. 2000IU daily gel caps   No facility-administered encounter medications on file as of 03/27/2018.     Review of Systems:  Review of Systems  Constitutional: Positive for malaise/fatigue. Negative for chills and fever.  HENT: Negative for congestion and hearing loss.   Eyes: Negative for blurred vision.  Respiratory: Negative for cough and shortness of breath.   Cardiovascular: Negative for chest pain, palpitations and leg  swelling.  Gastrointestinal: Positive for diarrhea. Negative for abdominal pain, blood in stool, constipation, heartburn, melena, nausea and vomiting.  Genitourinary: Negative for dysuria.  Musculoskeletal: Negative for falls and joint pain.       Had fallen frequently in the past, but not lately  Skin: Negative for itching and rash.  Neurological: Negative for dizziness and loss of consciousness.  Endo/Heme/Allergies: Does not bruise/bleed easily.  Psychiatric/Behavioral: Negative for depression and memory loss. The patient is not nervous/anxious and does not have insomnia.     Health Maintenance  Topic Date Due  . TETANUS/TDAP  07/16/2021  . INFLUENZA VACCINE  Completed  . DEXA SCAN  Completed  . PNA vac Low Risk Adult  Completed    Physical Exam: Vitals:   03/27/18 1100  BP: (!) 148/80  Pulse: 69  Temp: 98.4 F (36.9 C)  TempSrc: Oral  SpO2: 97%  Weight: 134 lb (60.8 kg)  Height: 5\' 3"  (1.6 m)   Body mass index is 23.74 kg/m. Physical Exam Vitals signs reviewed.  Constitutional:      Appearance: Normal appearance. She is normal weight.  HENT:     Head: Normocephalic and atraumatic.  Eyes:     Comments: glasses  Cardiovascular:     Rate and Rhythm: Normal rate and regular rhythm.     Pulses: Normal pulses.     Heart sounds: Murmur present.     Comments: Click audible during cardiac exam Pulmonary:     Effort: Pulmonary effort is normal.     Breath sounds: Normal breath sounds.  Abdominal:     General: Bowel sounds are normal.  Musculoskeletal: Normal range of motion.        General: No swelling or tenderness.  Skin:    General: Skin is warm and dry.     Capillary Refill: Capillary refill takes less than 2 seconds.  Neurological:     General: No focal deficit present.     Mental Status: She is alert and oriented to person, place, and time.  Psychiatric:        Mood and Affect: Mood normal.        Behavior: Behavior normal.        Thought Content: Thought  content normal.        Judgment: Judgment normal.     Labs reviewed: Basic Metabolic Panel: Recent Labs    12/16/17 0914  NA 142  K 4.6  CL 105  CO2 30  GLUCOSE 85  BUN 15  CREATININE 0.80  CALCIUM 9.9  TSH 3.65   Liver Function Tests: Recent Labs    12/16/17 0914  AST 15  ALT 10  BILITOT 0.6  PROT 6.7   No results for input(s): LIPASE, AMYLASE in the last 8760 hours. No results for input(s): AMMONIA in the last 8760 hours. CBC: Recent Labs    12/16/17 0914  WBC 4.4  NEUTROABS 2,143  HGB 13.5  HCT 39.2  MCV 92.9  PLT 251   Lipid Panel: Recent Labs    12/16/17 0914 03/07/18 0822  CHOL 225* 156  HDL 48* 59  LDLCALC 139* 77  TRIG 233* 112  CHOLHDL 4.7 2.6   No results found for: HGBA1C  Procedures since last visit: No results found.  Assessment/Plan 1. Osteopenia of multiple sites - cont vitamin D3 and return to regular weightbearing exercise like walking at gym or in her neighborhood  2. Hyperlipidemia, unspecified hyperlipidemia type - cont current statin therapy which did improve levels - Basic metabolic panel; Future - Lipid panel; Future  3. Memory loss -did well on mmse 29/30, but reports some concerns noted in hpi -no overt concerns notable in visit, monitor  4. Collagenous colitis -better lately after a period of terrible diarrhea -now using imodium prn  5. Carotid artery disease, unspecified laterality, unspecified type (HCC) -by screening studies, not [place] imaging or cardiovascular lab  6. Anemia, unspecified type - last hgb normal, but was anemic prior to that so will f/u before next visit - CBC with Differential/Platelet; Future   Labs/tests ordered:   Orders Placed This Encounter  Procedures  . CBC with Differential/Platelet    Standing Status:   Future    Standing Expiration Date:   03/28/2019  . Basic metabolic panel    Standing Status:   Future    Standing Expiration Date:   03/28/2019    Order Specific  Question:   Has the patient fasted?    Answer:   Yes  . Lipid panel    Standing Status:   Future    Standing Expiration Date:   03/28/2019    Order Specific Question:   Has the patient fasted?    Answer:   Yes   Next appt:  6 mos for CPE with Jessica, fasting labs before  Jullien Granquist L. Lesle Faron, D.O. Geriatrics [facility] [address], [place] [address] Cell Phone (Mon-Fri 8am-5pm):  [phone] On Call:  [phone] & follow prompts after 5pm & weekends Office Phone:  [phone] Office Fax:  [phone]

## 2018-03-27 NOTE — Patient Instructions (Signed)
Get back to walking in your neighborhood or at the gym on the treadmill.

## 2018-04-16 DIAGNOSIS — L821 Other seborrheic keratosis: Secondary | ICD-10-CM | POA: Diagnosis not present

## 2018-05-01 ENCOUNTER — Telehealth: Payer: Self-pay | Admitting: *Deleted

## 2018-05-01 NOTE — Telephone Encounter (Signed)
Joni Reining, daughter called and stated that she has seen you last and wanted your advise. Stated that she has real concern for patient's cognitive health. Stated that lately patient is thinking people are in her home and has been seeing her son that has been deceased for 30 years.  Daughter is very concerned and patient is still driving. Daughter is wanting to speak with you directly for guidance and advice. Please Advise.   Cell: [phone]

## 2018-05-01 NOTE — Telephone Encounter (Signed)
Spoke with daughter and scheduled a telephone visit tomorrow

## 2018-05-02 ENCOUNTER — Ambulatory Visit: Payer: PPO | Admitting: Internal Medicine

## 2018-05-02 ENCOUNTER — Other Ambulatory Visit: Payer: Self-pay

## 2018-05-02 NOTE — Telephone Encounter (Signed)
Tried to do a telephone visit with pt and daughter and per daughter she doesn't want to be around her mother just in case she has the virus or the pt has the virus.I advised pt would have to be present in order to do the telephone visit.  Daughter just wanted to know if your concerns are the same as hers? Please see message below from [place].  I have canceled her phone visit

## 2018-05-05 NOTE — Telephone Encounter (Signed)
She screened very well on her memory screening, would recommended coming to the office with pt once restrictions with COVID-19 are lifted.

## 2018-05-05 NOTE — Telephone Encounter (Signed)
.  left message to have patient return my call.  

## 2018-05-06 ENCOUNTER — Other Ambulatory Visit: Payer: Self-pay | Admitting: *Deleted

## 2018-05-06 NOTE — Patient Outreach (Signed)
HTA HRA follow up outreach. No answer but able to leave a message and requested a return call.  Zara Council. Burgess Estelle, MSN, [facility] Gerontological Nurse Practitioner [facility] Care Management [phone]

## 2018-05-09 ENCOUNTER — Other Ambulatory Visit: Payer: Self-pay | Admitting: *Deleted

## 2018-05-09 ENCOUNTER — Encounter: Payer: Self-pay | Admitting: *Deleted

## 2018-05-09 NOTE — Patient Outreach (Signed)
HTA HTA follow up outreach attempted but was not successful. I will try again in a few days.  Zara Council. Burgess Estelle, MSN, [facility] Gerontological Nurse Practitioner [facility] Care Management [phone]

## 2018-05-09 NOTE — Patient Outreach (Signed)
Second attempt to do HTA HRA follow up. I was able to leave a message and did request a return call.  I will send an unsuccessful letter to her.  Zara Council. Burgess Estelle, MSN, [facility] Gerontological Nurse Practitioner [facility] Care Management [phone]

## 2018-06-01 ENCOUNTER — Encounter: Payer: Self-pay | Admitting: Nurse Practitioner

## 2018-06-12 ENCOUNTER — Other Ambulatory Visit: Payer: Self-pay | Admitting: Nurse Practitioner

## 2018-06-12 DIAGNOSIS — Z1231 Encounter for screening mammogram for malignant neoplasm of breast: Secondary | ICD-10-CM

## 2018-07-01 ENCOUNTER — Other Ambulatory Visit: Payer: Self-pay | Admitting: Nurse Practitioner

## 2018-07-08 ENCOUNTER — Other Ambulatory Visit: Payer: Self-pay | Admitting: *Deleted

## 2018-07-08 MED ORDER — SIMVASTATIN 20 MG PO TABS: 20.0000 mg | ORAL_TABLET | Freq: Every day | ORAL | 1 refills | Status: DC

## 2018-07-08 NOTE — Telephone Encounter (Signed)
[facility] 

## 2018-08-05 ENCOUNTER — Ambulatory Visit: Payer: PPO

## 2018-08-25 ENCOUNTER — Telehealth: Payer: Self-pay

## 2018-08-25 NOTE — Telephone Encounter (Signed)
Patient's daughter called to reschedule patient's labs and 6 month follow up with Sherrie Mustache, NP. Daughter stated that patient will be moving into an assisted living facility. She is trying to have her moved by 10/07/2018 and states she will need a Covid test done on patient before she can be accepted in facility. Daughter would like to discuss with provider setting  up an appointment Covid testing.  Rescheduled patient's appointment and labs for same day at daughter's request.

## 2018-08-25 NOTE — Telephone Encounter (Signed)
When does she want to do the COVID testing? Did she want to discuss this at appt?

## 2018-08-25 NOTE — Telephone Encounter (Signed)
Daughter states that she would like to discuss this at her appointment on 10/07/2018.

## 2018-09-05 ENCOUNTER — Telehealth: Payer: Self-pay

## 2018-09-05 NOTE — Telephone Encounter (Signed)
Noted, I imagine there will be an FL2 that needs to be completed, I added follow up MMSE to appt report so we can see if there has been a significant decline from previous done in November 2019

## 2018-09-05 NOTE — Telephone Encounter (Signed)
Patient's daughter called to request an earlier appointment(originally scheduled for 10/07/2018. Patient now has an appointment for 09/11/2018. Patient is planning on moving into a senior home and the daughter is trying to decide if she needs additional care. Additional care will cost more and Elmyra Ricks is trying to avoid unecessary cost if it's not needed.   Elmyra Ricks is concerned that her mother has dementia and would like for the conversation to be bought up in a way that is not offensive and in a way that will not indicate that the daughter bought this to our attention.  FYI

## 2018-09-11 ENCOUNTER — Encounter: Payer: Self-pay | Admitting: Nurse Practitioner

## 2018-09-11 ENCOUNTER — Other Ambulatory Visit: Payer: Self-pay

## 2018-09-11 ENCOUNTER — Ambulatory Visit (INDEPENDENT_AMBULATORY_CARE_PROVIDER_SITE_OTHER): Payer: Medicare Other | Admitting: Nurse Practitioner

## 2018-09-11 VITALS — BP 150/82 | HR 68 | Temp 98.2°F | Ht 63.0 in | Wt 122.8 lb

## 2018-09-11 DIAGNOSIS — R634 Abnormal weight loss: Secondary | ICD-10-CM

## 2018-09-11 DIAGNOSIS — R413 Other amnesia: Secondary | ICD-10-CM

## 2018-09-11 DIAGNOSIS — D649 Anemia, unspecified: Secondary | ICD-10-CM

## 2018-09-11 DIAGNOSIS — E785 Hyperlipidemia, unspecified: Secondary | ICD-10-CM

## 2018-09-11 DIAGNOSIS — I10 Essential (primary) hypertension: Secondary | ICD-10-CM

## 2018-09-11 LAB — LIPID PANEL
Cholesterol: 165 mg/dL (ref <200)
HDL: 62 mg/dL (ref >=50)
LDL Cholesterol (Calc): 80 mg/dL (calc)
Non-HDL Cholesterol (Calc): 103 mg/dL (calc) (ref <130)
Total CHOL/HDL Ratio: 2.7 (calc) (ref <5.0)
Triglycerides: 136 mg/dL (ref <150)

## 2018-09-11 LAB — CBC WITH DIFFERENTIAL/PLATELET
Absolute Monocytes: 350 cells/uL (ref 200–950)
Basophils Absolute: 41 cells/uL (ref 0–200)
Basophils Relative: 0.9 %
Eosinophils Absolute: 69 cells/uL (ref 15–500)
Eosinophils Relative: 1.5 %
HCT: 38.4 % (ref 35.0–45.0)
Hemoglobin: 12.8 g/dL (ref 11.7–15.5)
Lymphs Abs: 1352 cells/uL (ref 850–3900)
MCH: 31.2 pg (ref 27.0–33.0)
MCHC: 33.3 g/dL (ref 32.0–36.0)
MCV: 93.7 fL (ref 80.0–100.0)
MPV: 9.7 fL (ref 7.5–12.5)
Monocytes Relative: 7.6 %
Neutro Abs: 2788 cells/uL (ref 1500–7800)
Neutrophils Relative %: 60.6 %
Platelets: 273 10*3/uL (ref 140–400)
RBC: 4.1 10*6/uL (ref 3.80–5.10)
RDW: 12.2 % (ref 11.0–15.0)
Total Lymphocyte: 29.4 %
WBC: 4.6 10*3/uL (ref 3.8–10.8)

## 2018-09-11 LAB — COMPLETE METABOLIC PANEL WITH GFR
AG Ratio: 2.2 (calc) (ref 1.0–2.5)
ALT: 18 U/L (ref 6–29)
AST: 20 U/L (ref 10–35)
Albumin: 4.6 g/dL (ref 3.6–5.1)
Alkaline phosphatase (APISO): 55 U/L (ref 37–153)
BUN: 14 mg/dL (ref 7–25)
CO2: 30 mmol/L (ref 20–32)
Calcium: 10.1 mg/dL (ref 8.6–10.4)
Chloride: 106 mmol/L (ref 98–110)
Creat: 0.89 mg/dL (ref 0.60–0.93)
GFR, Est African American: 71 mL/min/{1.73_m2} (ref >=60)
GFR, Est Non African American: 62 mL/min/{1.73_m2} (ref >=60)
Globulin: 2.1 g/dL (calc) (ref 1.9–3.7)
Glucose, Bld: 93 mg/dL (ref 65–99)
Potassium: 4.7 mmol/L (ref 3.5–5.3)
Sodium: 143 mmol/L (ref 135–146)
Total Bilirubin: 0.6 mg/dL (ref 0.2–1.2)
Total Protein: 6.7 g/dL (ref 6.1–8.1)

## 2018-09-11 MED ORDER — DONEPEZIL HCL 10 MG PO TABS: ORAL_TABLET | ORAL | 0 refills | Status: DC

## 2018-09-11 NOTE — Patient Instructions (Addendum)
To start aricept 1/2 tablet  By mouth x 1 month then increase to 1 whole tablet at bedtime   Follow up in 6 weeks  To take blood pressure at home, once you have been sitting for at least 5 mins.  To log blood pressure, take 2-3 times a week
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
## 2018-09-16 ENCOUNTER — Other Ambulatory Visit: Payer: Self-pay

## 2018-09-16 ENCOUNTER — Encounter: Payer: Self-pay | Admitting: Family

## 2018-09-16 ENCOUNTER — Ambulatory Visit (INDEPENDENT_AMBULATORY_CARE_PROVIDER_SITE_OTHER): Payer: Medicare Other | Admitting: Family

## 2018-09-16 DIAGNOSIS — R195 Other fecal abnormalities: Secondary | ICD-10-CM

## 2018-09-16 NOTE — Addendum Note (Signed)
Addended by: Ruthell Rummage A on: 09/16/2018 04:21 PM   Modules accepted: Orders

## 2018-09-16 NOTE — Progress Notes (Signed)
This service is provided via telemedicine  No vital signs collected/recorded due to the encounter was a telemedicine visit.   Location of patient (ex: home, work):  Home   Patient consents to a telephone visit:  Yes  Location of the provider (ex: office, home):  Office   Name of any referring provider:  Sherrie Mustache, NP   Names of all persons participating in the telemedicine service and their role in the encounter: Marlowe Sax, NP, Ruthell Rummage CMA, and Leward Quan   Time spent on call:  Ruthell Rummage CMA  Spent 7 minutes with patient    Provider: Seleen Walter FNP-C  Lauree Chandler, NP  Patient Care Team: Lauree Chandler, NP as PCP - General (Geriatric Medicine) Armbruster, Carlota Raspberry, MD as Consulting Physician (Gastroenterology) Ralene Bathe, MD as Consulting Physician (Ophthalmology)  Extended Emergency Contact Information Primary Emergency Contact: Record,Nicole Address: [address]          [place], [place] [address] Johnnette Litter of [place] Phone: [phone] Work Phone: [phone] Mobile Phone: [phone] Relation: Daughter  Code Status:  Goals of care: Advanced Directive information Advanced Directives 09/11/2018  Does Patient Have a Medical Advance Directive? Yes  Type of Paramedic of [place];Out of facility DNR (pink MOST or yellow form)  Does patient want to make changes to medical advance directive? No - Patient declined  Copy of [place] in Chart? Yes - validated most recent copy scanned in chart (See row information)  Pre-existing out of facility DNR order (yellow form or pink MOST form) Yellow form placed in chart (order not valid for inpatient use)     Chief Complaint  Patient presents with  . Acute Visit    Patient states Donepezil is causing black stools, she states last Tuesday she took medication and next day stools were black.     HPI:  Pt is a 78 y.o. female seen  today for an acute visit for evaluation of black stool since starting on Donepezil. she states started on donepezil last Tuesday and the next day she noticed stools were black.she denies any abdominal pain or easily bleeding.she is not taking any Asprin.No other signs of bleeding.Patient currently on Iron supplements though states has take iron for many years without any black stool. She denies any headache,dizziness or fatigue. Recent Hgb 12.8(09/11/2018).   Past Medical History:  Diagnosis Date  . CAD (coronary artery disease)   . Collagenous colitis   . Female bladder prolapse   . Herpes zoster   . Hyperlipidemia   . Normocytic anemia   . Osteoarthritis   . Osteopenia   . Right knee meniscal tear   . Senile purpura ([place])    Past Surgical History:  Procedure Laterality Date  . ABDOMINAL HYSTERECTOMY    . BREAST EXCISIONAL BIOPSY    . CATARACT EXTRACTION Right 04/2017  . CATARACT EXTRACTION Left 05/2017    No Known Allergies  Outpatient Encounter Medications as of 09/16/2018  Medication Sig  . Ascorbic Acid (VITAMIN C) 1000 MG tablet Take 1,000 mg daily by mouth.  . B Complex-C-Folic Acid (STRESS B COMPLEX PO) Take by mouth.  Marland Kitchen CALCIUM PO Take by mouth. 1200 mg tablets , she takes 2 daily at one time  . Cholecalciferol (VITAMIN D3) 125 MCG (5000 UT) CAPS Take 1 capsule by mouth daily.  Marland Kitchen donepezil (ARICEPT) 10 MG tablet 1/2 tablet by mouth at bedtime, then increase to 1 tablet  . Garlic 4235 MG CAPS  Take by mouth.  . Multiple Vitamin (MULTIVITAMIN) capsule Take 1 capsule daily by mouth.  . simvastatin (ZOCOR) 20 MG tablet Take 1 tablet (20 mg total) by mouth daily.  . [DISCONTINUED] Cholecalciferol (VITAMIN D3) 50 MCG (2000 UT) TABS Take 1 tablet by mouth daily.   No facility-administered encounter medications on file as of 09/16/2018.     Review of Systems  Constitutional: Negative for appetite change, chills, fatigue and fever.  Respiratory: Negative for cough, chest  tightness, shortness of breath and wheezing.   Cardiovascular: Negative for chest pain, palpitations and leg swelling.  Gastrointestinal: Negative for abdominal distention, abdominal pain, diarrhea, nausea and vomiting.       Reports dark stool since starting on Aricept.   Genitourinary: Negative for difficulty urinating, dyspareunia, dysuria, flank pain, frequency, hematuria and urgency.  Musculoskeletal: Negative for myalgias.  Skin: Negative for color change, pallor and rash.  Neurological: Negative for dizziness, light-headedness and headaches.  Hematological: Does not bruise/bleed easily.  Psychiatric/Behavioral: Negative for sleep disturbance. The patient is not nervous/anxious.     Immunization History  Administered Date(s) Administered  . Influenza, High Dose Seasonal PF 11/22/2016, 10/31/2017  . Influenza-Unspecified 11/21/2010, 12/11/2011, 12/11/2014, 10/29/2015  . Pneumococcal Conjugate-13 08/24/2015  . Pneumococcal Polysaccharide-23 05/07/2006  . Tetanus 02/05/2001, 07/17/2011  . Zoster 10/04/2005   Pertinent  Health Maintenance Due  Topic Date Due  . INFLUENZA VACCINE  09/06/2018  . DEXA SCAN  Completed  . PNA vac Low Risk Adult  Completed   Fall Risk  09/16/2018 09/11/2018 03/27/2018 12/25/2017 12/25/2017  Falls in the past year? 1 1 0 0 0  Number falls in past yr: 1 1 0 0 0  Injury with Fall? 0 0 0 0 0  Risk for fall due to : - History of fall(s);Impaired balance/gait - - -    There were no vitals filed for this visit. There is no height or weight on file to calculate BMI. Physical Exam  unable to complete on Telephone.  Labs reviewed: Recent Labs    12/16/17 0914 09/11/18 0927  NA 142 143  K 4.6 4.7  CL 105 106  CO2 30 30  GLUCOSE 85 93  BUN 15 14  CREATININE 0.80 0.89  CALCIUM 9.9 10.1   Recent Labs    12/16/17 0914 09/11/18 0927  AST 15 20  ALT 10 18  BILITOT 0.6 0.6  PROT 6.7 6.7   Recent Labs    12/16/17 0914 09/11/18 0927  WBC 4.4 4.6   NEUTROABS 2,143 2,788  HGB 13.5 12.8  HCT 39.2 38.4  MCV 92.9 93.7  PLT 251 273   Lab Results  Component Value Date   TSH 3.65 12/16/2017   No results found for: HGBA1C Lab Results  Component Value Date   CHOL 165 09/11/2018   HDL 62 09/11/2018   LDLCALC 80 09/11/2018   TRIG 136 09/11/2018   CHOLHDL 2.7 09/11/2018    Significant Diagnostic Results in last 30 days:  No results found.  Assessment/Plan   Dark stools Asymptomatic.associates color of stool with new medication donepezil though also taking Iron supplement.Not on ASA.will rule out GI bleed.Guiac stool Kit mailed to patient's address by [place]. Patient to Notify provider's office if symptoms worsen.   Family/ staff Communication: Reviewed plan of care with patient.  Labs/tests ordered: Guaiaci stool.     Spent 11 minutes of non-face to face with patient    Sandrea Hughs, NP

## 2018-09-17 NOTE — Addendum Note (Signed)
Addended byMarlowe Sax C on: 09/17/2018 10:17 AM   Modules accepted: Level of Service

## 2018-09-22 ENCOUNTER — Other Ambulatory Visit: Payer: PPO

## 2018-09-25 ENCOUNTER — Ambulatory Visit: Payer: PPO | Admitting: Nurse Practitioner

## 2018-10-07 ENCOUNTER — Other Ambulatory Visit: Payer: Self-pay

## 2018-10-07 ENCOUNTER — Ambulatory Visit: Payer: Self-pay | Admitting: Nurse Practitioner

## 2018-10-07 ENCOUNTER — Telehealth: Payer: Self-pay | Admitting: *Deleted

## 2018-10-07 ENCOUNTER — Encounter: Payer: Self-pay | Admitting: *Deleted

## 2018-10-07 NOTE — Telephone Encounter (Signed)
Daughter, Elmyra Ricks called and stated that patient has moved to the [place] this past weekend due to memory.  Patient verbally gave [facility] a 2 year contract before she moved. They told daughter if she would get a Dr. Note stating that there were some memory issues they would waive the $400.00 contract fee.  Please Advise.

## 2018-10-07 NOTE — Telephone Encounter (Signed)
Vanessa Chavez, daughter called and left message on Clinical Intake stating that she had a concern.   Tried calling daughter back and LMOM to return call.

## 2018-10-07 NOTE — Telephone Encounter (Signed)
Completed and printed

## 2018-10-08 NOTE — Telephone Encounter (Signed)
Informed daughter that letter was ready for pick up. Left up front.

## 2018-10-24 ENCOUNTER — Other Ambulatory Visit: Payer: Self-pay

## 2018-10-24 ENCOUNTER — Ambulatory Visit (INDEPENDENT_AMBULATORY_CARE_PROVIDER_SITE_OTHER): Payer: Medicare Other | Admitting: Nurse Practitioner

## 2018-10-24 ENCOUNTER — Telehealth: Payer: Self-pay | Admitting: *Deleted

## 2018-10-24 ENCOUNTER — Encounter: Payer: Self-pay | Admitting: Nurse Practitioner

## 2018-10-24 VITALS — BP 146/82 | HR 66 | Temp 97.1°F | Ht 63.0 in | Wt 119.6 lb

## 2018-10-24 DIAGNOSIS — R634 Abnormal weight loss: Secondary | ICD-10-CM | POA: Diagnosis not present

## 2018-10-24 DIAGNOSIS — I10 Essential (primary) hypertension: Secondary | ICD-10-CM

## 2018-10-24 DIAGNOSIS — R195 Other fecal abnormalities: Secondary | ICD-10-CM

## 2018-10-24 DIAGNOSIS — R413 Other amnesia: Secondary | ICD-10-CM

## 2018-10-24 DIAGNOSIS — E785 Hyperlipidemia, unspecified: Secondary | ICD-10-CM

## 2018-10-24 DIAGNOSIS — Z23 Encounter for immunization: Secondary | ICD-10-CM

## 2018-10-24 LAB — CBC WITH DIFFERENTIAL/PLATELET
Absolute Monocytes: 610 cells/uL (ref 200–950)
Basophils Absolute: 80 cells/uL (ref 0–200)
Basophils Relative: 1.6 %
Eosinophils Absolute: 330 cells/uL (ref 15–500)
Eosinophils Relative: 6.6 %
HCT: 37.4 % (ref 35.0–45.0)
Hemoglobin: 12.5 g/dL (ref 11.7–15.5)
Lymphs Abs: 1405 cells/uL (ref 850–3900)
MCH: 30.6 pg (ref 27.0–33.0)
MCHC: 33.4 g/dL (ref 32.0–36.0)
MCV: 91.7 fL (ref 80.0–100.0)
MPV: 9.6 fL (ref 7.5–12.5)
Monocytes Relative: 12.2 %
Neutro Abs: 2575 cells/uL (ref 1500–7800)
Neutrophils Relative %: 51.5 %
Platelets: 339 10*3/uL (ref 140–400)
RBC: 4.08 10*6/uL (ref 3.80–5.10)
RDW: 12.4 % (ref 11.0–15.0)
Total Lymphocyte: 28.1 %
WBC: 5 10*3/uL (ref 3.8–10.8)

## 2018-10-24 MED ORDER — SIMVASTATIN 20 MG PO TABS: 20.0000 mg | ORAL_TABLET | Freq: Every day | ORAL | 1 refills | Status: DC

## 2018-10-24 MED ORDER — NAMENDA XR TITRATION PACK 7 & 14 & 21 &28 MG PO CP24: ORAL_CAPSULE | ORAL | 0 refills | Status: DC

## 2018-10-24 MED ORDER — MEMANTINE HCL 28 X 5 MG & 21 X 10 MG PO TABS: ORAL_TABLET | ORAL | 0 refills | Status: DC

## 2018-10-24 MED ORDER — DONEPEZIL HCL 10 MG PO TABS: 10.0000 mg | ORAL_TABLET | Freq: Every day | ORAL | 0 refills | Status: DC

## 2018-10-24 NOTE — Patient Instructions (Addendum)
Call [facility] imagining to set up appt for head CT [phone]  To complete stool kit and return to office  To start namenda titration pack as directed if you complete the titration pack without problems to notify office for refill and we will prescribe namenda XR 28 mg daily  To add protein supplement daily for ongoing weight loss

## 2018-10-24 NOTE — Telephone Encounter (Signed)
Pharmacy notified and agreed.  Medication list updated.

## 2018-10-24 NOTE — Telephone Encounter (Signed)
aricept 10 mg by mouth daily  Please call and see if they can cancel that order and do a regular namenda titration pack 5 mg daily for 1 week. 5 mg BID for 1 week, 10 mg in am and 5 mg in pm for 1 week and then 10 mg BID

## 2018-10-24 NOTE — Progress Notes (Signed)
Careteam: Patient Care Team: Lauree Chandler, NP as PCP - General (Geriatric Medicine) Armbruster, Carlota Raspberry, MD as Consulting Physician (Gastroenterology) Ralene Bathe, MD as Consulting Physician (Ophthalmology)  Advanced Directive information Does Patient Have a Medical Advance Directive?: Yes, Type of Advance Directive: [place];Out of facility DNR (pink MOST or yellow form), Pre-existing out of facility DNR order (yellow form or pink MOST form): Yellow form placed in chart (order not valid for inpatient use), Does patient want to make changes to medical advance directive?: No - Patient declined  No Known Allergies  Chief Complaint  Patient presents with   Follow-up    6 week follow-up    Immunizations    Flu vaccine today    Medication Refill    Refill Simvastatin and Aricept     HPI: Patient is a 78 y.o. female seen in the office today for follow up on memory loss, weight loss and mood.  Moved on sept 1, move went well and likes her new place. Reports they feed her well but does not eat much. Decrease appetite. Daughter states she eats 2 of 3 meals. Attempts to eat 3 meals but sometimes "cant do it"  Memory loss- reports aricept gave her dark stools it cleared up after she stopped it. She restarted medication and stools became dark again but did not complete. Stool remains dark (also on iron supplement). She does not have a pill box. Recently moved and lost stool kit. Progressive decline since move.  htn-  Did not check at home. Remains slightly elevated today.  Does not take medication for blood pressure.  Does not use salt on food.   Anxiety-daughter reports its harder to judge due to the recent mood. Progressive memory loss.     Review of Systems:  Review of Systems  Constitutional: Negative for chills, fever and malaise/fatigue.  HENT: Negative for congestion and hearing loss.   Eyes: Negative for blurred vision.  Respiratory:  Negative for cough and shortness of breath.   Cardiovascular: Negative for chest pain, palpitations and leg swelling.  Gastrointestinal: Negative for abdominal pain, blood in stool, constipation, diarrhea, heartburn, melena, nausea and vomiting.       Dark stools.  Genitourinary: Negative for dysuria.  Musculoskeletal: Negative for falls and joint pain.  Skin: Negative for itching and rash.  Neurological: Negative for dizziness and loss of consciousness.  Endo/Heme/Allergies: Does not bruise/bleed easily.  Psychiatric/Behavioral: Positive for memory loss. Negative for depression. The patient is nervous/anxious and has insomnia.     Past Medical History:  Diagnosis Date   CAD (coronary artery disease)    Collagenous colitis    Female bladder prolapse    Herpes zoster    Hyperlipidemia    Normocytic anemia    Osteoarthritis    Osteopenia    Right knee meniscal tear    Senile purpura ([place])    Past Surgical History:  Procedure Laterality Date   ABDOMINAL HYSTERECTOMY     BREAST EXCISIONAL BIOPSY     CATARACT EXTRACTION Right 04/2017   CATARACT EXTRACTION Left 05/2017   Social History:   reports that she quit smoking about 12 years ago. She has a 60.00 pack-year smoking history. She has never used smokeless tobacco. She reports current alcohol use. She reports that she does not use drugs.  Family History  Problem Relation Age of Onset   Pneumonia Mother 71   Hypertension Mother    Cirrhosis Father 31  liver   Cancer - Lung Father    Cancer Brother        sinuses   Suicidality Son    Heart Problems Maternal Grandmother 18    Medications: Patient's Medications  New Prescriptions   No medications on file  Previous Medications   ASCORBIC ACID (VITAMIN C) 1000 MG TABLET    Take 1,000 mg daily by mouth.   B COMPLEX-C-FOLIC ACID (STRESS B COMPLEX PO)    Take by mouth.   CALCIUM PO    Take by mouth. 1200 mg tablets , she takes 2 daily at one time    CHOLECALCIFEROL (VITAMIN D3) 125 MCG (5000 UT) CAPS    Take 1 capsule by mouth daily.   DONEPEZIL (ARICEPT) 10 MG TABLET    1/2 tablet by mouth at bedtime, then increase to 1 tablet   GARLIC 2725 MG CAPS    Take by mouth.   MULTIPLE VITAMIN (MULTIVITAMIN) CAPSULE    Take 1 capsule daily by mouth.   SIMVASTATIN (ZOCOR) 20 MG TABLET    Take 1 tablet (20 mg total) by mouth daily.  Modified Medications   No medications on file  Discontinued Medications   No medications on file    Physical Exam:  Vitals:   10/24/18 0832  BP: 140/82  Pulse: 66  Temp: (!) 97.1 F (36.2 C)  TempSrc: Temporal  SpO2: 97%  Weight: 119 lb 9.6 oz (54.3 kg)  Height: _0  (1.6 m)   Body mass index is 21.19 kg/m. Wt Readings from Last 3 Encounters:  10/24/18 119 lb 9.6 oz (54.3 kg)  09/11/18 122 lb 12.8 oz (55.7 kg)  03/27/18 134 lb (60.8 kg)    Physical Exam Vitals signs reviewed.  Constitutional:      Appearance: Normal appearance. She is normal weight.  HENT:     Head: Normocephalic and atraumatic.  Eyes:     Comments: glasses  Cardiovascular:     Rate and Rhythm: Normal rate and regular rhythm.     Pulses: Normal pulses.     Heart sounds: Murmur present.  Pulmonary:     Effort: Pulmonary effort is normal.     Breath sounds: Normal breath sounds.  Abdominal:     General: Bowel sounds are normal.  Musculoskeletal: Normal range of motion.        General: No swelling or tenderness.  Skin:    General: Skin is warm and dry.     Capillary Refill: Capillary refill takes less than 2 seconds.  Neurological:     General: No focal deficit present.     Mental Status: She is alert and oriented to person, place, and time.  Psychiatric:        Mood and Affect: Mood normal.        Behavior: Behavior normal.        Thought Content: Thought content normal.        Judgment: Judgment normal.     Labs reviewed: Basic Metabolic Panel: Recent Labs    12/16/17 0914 09/11/18 0927  NA 142 143  K 4.6  4.7  CL 105 106  CO2 30 30  GLUCOSE 85 93  BUN 15 14  CREATININE 0.80 0.89  CALCIUM 9.9 10.1  TSH 3.65  --    Liver Function Tests: Recent Labs    12/16/17 0914 09/11/18 0927  AST 15 20  ALT 10 18  BILITOT 0.6 0.6  PROT 6.7 6.7   No results for input(s): LIPASE, AMYLASE in  the last 8760 hours. No results for input(s): AMMONIA in the last 8760 hours. CBC: Recent Labs    12/16/17 0914 09/11/18 0927  WBC 4.4 4.6  NEUTROABS 2,143 2,788  HGB 13.5 12.8  HCT 39.2 38.4  MCV 92.9 93.7  PLT 251 273   Lipid Panel: Recent Labs    12/16/17 0914 03/07/18 0822 09/11/18 0927  CHOL 225* 156 165  HDL 48* 59 62  LDLCALC 139* 77 80  TRIG 233* 112 136  CHOLHDL 4.7 2.6 2.7   TSH: Recent Labs    12/16/17 0914  TSH 3.65   A1C: No results found for: HGBA1C   Assessment/Plan 1. Need for influenza vaccination - Flu Vaccine QUAD High Dose(Fluad)  2. Dark stools -takes iron, will get FOB as well to evaluate for blood. - CBC with Differential/Platelet- to follow up hgb  3. Memory loss -ongoing. Tolerating aricept will add ST referral at this time at the request of daughter. Add namenda to aricept  - Ambulatory referral to [facility] - Memantine HCl ER (NAMENDA XR TITRATION PACK) 7 & 14 & 21 &28 MG CP24; As directed  Dispense: 28 capsule; Refill: 0 -encourage to get CT head  4. Weight loss Ongoing weight loss noted   5. Hyperlipidemia, unspecified hyperlipidemia type Refill provided - simvastatin (ZOCOR) 20 MG tablet; Take 1 tablet (20 mg total) by mouth daily.  Dispense: 90 tablet; Refill: 1  6. Essential hypertension Does not check blood pressure at home but report they will start, blood pressure elevated today but gets anxious in office. Goal BP <140/90. Complaint with diet modifications.    Next appt: 3 months for routine follow up [place]. [place], [facility] [phone]

## 2018-10-24 NOTE — Telephone Encounter (Signed)
Aniceto Boss with [facility] called and stated they needed to clarify patient's rx's   1. Donepezil has two different directions. Needs Clarification.   2. Namenda XR is going to cost $95.00, no generic, needs alternative.   Please Advise.

## 2018-10-28 ENCOUNTER — Other Ambulatory Visit: Payer: Self-pay

## 2018-10-28 DIAGNOSIS — R195 Other fecal abnormalities: Secondary | ICD-10-CM

## 2018-10-29 LAB — FECAL GLOBIN BY IMMUNOCHEMISTRY
FECAL GLOBIN RESULT:: NOT DETECTED
MICRO NUMBER:: 909055
SPECIMEN QUALITY:: ADEQUATE

## 2018-11-04 ENCOUNTER — Telehealth: Payer: Self-pay | Admitting: *Deleted

## 2018-11-04 NOTE — Telephone Encounter (Signed)
LMOM to return call.

## 2018-11-04 NOTE — Telephone Encounter (Signed)
I will review the CT and based on the findings will go from there. If she wants to hold off on namenda that is fine, generally very well tolerated and does not have significant side effects but if she would like to hold off for now we will note it. She can also make an appt with me to discuss findings of CT and med after the scan is done

## 2018-11-04 NOTE — Telephone Encounter (Signed)
Elmyra Ricks Record, Caregiver called and stated that she had some concerns:  1. Namenda still costs too much and she is unsure of the side effects. Stated that they are the same as the Donepezil with sleeping and weight loss and she is afraid of putting her on a second medication with these side effects.   2. Patient is getting her CT done on 10/6 and wants to know after she gets it done if you are going to place a referral to a Neurologist.   Please Advise.

## 2018-11-10 NOTE — Telephone Encounter (Signed)
LMOM to return call.

## 2018-11-11 ENCOUNTER — Other Ambulatory Visit: Payer: Self-pay | Admitting: Nurse Practitioner

## 2018-11-11 ENCOUNTER — Other Ambulatory Visit: Payer: Self-pay

## 2018-11-11 ENCOUNTER — Ambulatory Visit: Payer: Medicare Other

## 2018-11-11 ENCOUNTER — Telehealth: Payer: Self-pay | Admitting: Nurse Practitioner

## 2018-11-11 DIAGNOSIS — R413 Other amnesia: Secondary | ICD-10-CM

## 2018-11-11 NOTE — Telephone Encounter (Signed)
LMOM to return call.

## 2018-11-11 NOTE — Telephone Encounter (Signed)
pts daughter called to say that on arrival at [facility] today(10/6), they were informed that Vanessa Chavez was out of network & would have to pay $500 to have the CT completed.  Can you change the order/referral to an "in network" provider such as [facility]?  Please advise. Thanks, Lattie Haw

## 2018-11-11 NOTE — Telephone Encounter (Signed)
Order resubmitted for [facility]

## 2018-11-14 DIAGNOSIS — I10 Essential (primary) hypertension: Secondary | ICD-10-CM

## 2018-11-14 DIAGNOSIS — D649 Anemia, unspecified: Secondary | ICD-10-CM

## 2018-11-14 DIAGNOSIS — R634 Abnormal weight loss: Secondary | ICD-10-CM

## 2018-11-14 DIAGNOSIS — M1990 Unspecified osteoarthritis, unspecified site: Secondary | ICD-10-CM

## 2018-11-14 DIAGNOSIS — Z9181 History of falling: Secondary | ICD-10-CM

## 2018-11-14 DIAGNOSIS — F0390 Unspecified dementia without behavioral disturbance: Secondary | ICD-10-CM

## 2018-11-14 DIAGNOSIS — E785 Hyperlipidemia, unspecified: Secondary | ICD-10-CM

## 2018-11-14 DIAGNOSIS — I2510 Atherosclerotic heart disease of native coronary artery without angina pectoris: Secondary | ICD-10-CM

## 2018-11-20 ENCOUNTER — Telehealth: Payer: Self-pay | Admitting: Nurse Practitioner

## 2018-11-20 NOTE — Telephone Encounter (Signed)
Please schedule pt and daughter for an appt if she would like to discuss

## 2018-11-20 NOTE — Telephone Encounter (Signed)
Lm for daughter(Nicole) to call & sch appt (televisit or in office) with Janett Billow to discuss options for diagnosis.  Thanks, Lattie Haw

## 2018-11-20 NOTE — Telephone Encounter (Signed)
Pts daughter called with concerns about cost of CT. We finally got CT approved from [employer]. Her  [employer] imaging plan has no participating provider in [place]. Meaning she will pay 30% coinsurance cost of any imaging.  Vanessa Chavez(daughter)reached out to [facility] for CT pricing & no one has called her back, so she is cancelling her moms appt scheduled for 11/21/18.   Vanessa Chavez is considering going back to [place] for CT, if it would be cheaper. She would like for you to call her regarding if the imaging is necessary to diagnosis the issue or do you feel that other options, such as cognitive testing or referral to a specialist that would help get the diagnosis they need to move forward.  Please reach out to daughter Vanessa Chavez [phone]  Thanks, Vilinda Blanks

## 2018-11-21 ENCOUNTER — Ambulatory Visit (HOSPITAL_COMMUNITY): Payer: Medicare Other

## 2018-11-24 ENCOUNTER — Other Ambulatory Visit: Payer: Self-pay | Admitting: Nurse Practitioner

## 2018-11-24 DIAGNOSIS — R413 Other amnesia: Secondary | ICD-10-CM

## 2018-11-26 ENCOUNTER — Ambulatory Visit (INDEPENDENT_AMBULATORY_CARE_PROVIDER_SITE_OTHER): Payer: Medicare Other | Admitting: Nurse Practitioner

## 2018-11-26 ENCOUNTER — Encounter: Payer: Self-pay | Admitting: Nurse Practitioner

## 2018-11-26 ENCOUNTER — Other Ambulatory Visit: Payer: Self-pay

## 2018-11-26 ENCOUNTER — Encounter: Payer: Self-pay | Admitting: Neurology

## 2018-11-26 VITALS — BP 138/64 | HR 61 | Temp 97.5°F | Resp 20 | Ht 63.0 in | Wt 123.8 lb

## 2018-11-26 DIAGNOSIS — I10 Essential (primary) hypertension: Secondary | ICD-10-CM

## 2018-11-26 DIAGNOSIS — I872 Venous insufficiency (chronic) (peripheral): Secondary | ICD-10-CM | POA: Diagnosis not present

## 2018-11-26 DIAGNOSIS — R413 Other amnesia: Secondary | ICD-10-CM

## 2018-11-26 NOTE — Progress Notes (Signed)
Careteam: Patient Care Team: Lauree Chandler, NP as PCP - General (Geriatric Medicine) Armbruster, Carlota Raspberry, MD as Consulting Physician (Gastroenterology) Ralene Bathe, MD as Consulting Physician (Ophthalmology)  Advanced Directive information    No Known Allergies  Chief Complaint  Patient presents with  . Acute Visit    Discuss need for CT Scan     HPI: Patient is a 78 y.o. female seen in the office today to discuss CT.  namenda is too expensive even at gener[employer].  Pt has major diarrhea and still has bouts. Very concerned that medication could cause it. Taking Aricept without issue. Pt with progressive memory decline and hallucinations, she has been moved to apartments that will keep an eye on her but is independent living. Has had home health consult with [facility] which is in progress.   Daughter reports she needs a diagnosis, can not just settle with generic dementia. Insurance is not covering CT due to "particating partnership" not an in network issue. Wants a neurologist referral- sub specialist in dementia  Also feel and hurt knee a few weeks ago, wearing brace. The knee is overall better but some swelling below the brace. No pain, redness noted. Goes away over night and worse throughout the day.   Review of Systems:  Review of Systems  Constitutional: Negative for chills and fever.  Respiratory: Negative for cough and shortness of breath.   Cardiovascular: Positive for leg swelling. Negative for chest pain and palpitations.  Psychiatric/Behavioral: Positive for hallucinations and memory loss. Negative for depression. The patient is not nervous/anxious.     Past Medical History:  Diagnosis Date  . CAD (coronary artery disease)   . Collagenous colitis   . Female bladder prolapse   . Herpes zoster   . Hyperlipidemia   . Normocytic anemia   . Osteoarthritis   . Osteopenia   . Right knee meniscal tear   . Senile purpura ([place])    Past Surgical History:   Procedure Laterality Date  . ABDOMINAL HYSTERECTOMY    . BREAST EXCISIONAL BIOPSY    . CATARACT EXTRACTION Right 04/2017  . CATARACT EXTRACTION Left 05/2017   Social History:   reports that she quit smoking about 12 years ago. She has a 60.00 pack-year smoking history. She has never used smokeless tobacco. She reports current alcohol use. She reports that she does not use drugs.  Family History  Problem Relation Age of Onset  . Pneumonia Mother 21  . Hypertension Mother   . Cirrhosis Father 65       liver  . Cancer - Lung Father   . Cancer Brother        sinuses  . Suicidality Son   . Heart Problems Maternal Grandmother 47    Medications: Patient's Medications  New Prescriptions   No medications on file  Previous Medications   ASCORBIC ACID (VITAMIN C) 1000 MG TABLET    Take 1,000 mg daily by mouth.   B COMPLEX-C-FOLIC ACID (STRESS B COMPLEX PO)    Take by mouth.   CALCIUM PO    Take by mouth. 1200 mg tablets , she takes 2 daily at one time   CHOLECALCIFEROL (VITAMIN D3) 125 MCG (5000 UT) CAPS    Take 1 capsule by mouth daily.   DONEPEZIL (ARICEPT) 10 MG TABLET    TAKE 1 TABLET BY MOUTH AT BEDTIME   GARLIC 6606 MG CAPS    Take by mouth.   MEMANTINE (NAMENDA TITRATION PACK) TABLET PACK  Take by mouth See admin instructions. 5 mg/day for =1 week; 5 mg twice daily for =1 week; Then 10 mg in the am and 5 mg in the pm  for =1 week; then 10 mg twice daily   MULTIPLE VITAMIN (MULTIVITAMIN) CAPSULE    Take 1 capsule daily by mouth.   SIMVASTATIN (ZOCOR) 20 MG TABLET    Take 1 tablet (20 mg total) by mouth daily.  Modified Medications   No medications on file  Discontinued Medications   No medications on file    Physical Exam:  Vitals:   11/26/18 0841 11/26/18 0931  BP: (!) 150/78 138/64  Pulse: 61   Resp: 20   Temp: (!) 97.5 F (36.4 C)   TempSrc: Oral   SpO2: 97%   Weight: 123 lb 12.8 oz (56.2 kg)   Height: 5\' 3"  (1.6 m)    Body mass index is 21.93 kg/m. Wt  Readings from Last 3 Encounters:  11/26/18 123 lb 12.8 oz (56.2 kg)  10/24/18 119 lb 9.6 oz (54.3 kg)  09/11/18 122 lb 12.8 oz (55.7 kg)    Physical Exam Constitutional:      General: She is not in acute distress.    Appearance: She is well-developed. She is not diaphoretic.  HENT:     Head: Normocephalic and atraumatic.     Mouth/Throat:     Pharynx: No oropharyngeal exudate.  Eyes:     Conjunctiva/sclera: Conjunctivae normal.     Pupils: Pupils are equal, round, and reactive to light.  Neck:     Musculoskeletal: Normal range of motion and neck supple.  Cardiovascular:     Rate and Rhythm: Normal rate and regular rhythm.     Heart sounds: Murmur present.  Pulmonary:     Effort: Pulmonary effort is normal.     Breath sounds: Normal breath sounds.  Abdominal:     General: Bowel sounds are normal.     Palpations: Abdomen is soft.  Musculoskeletal:        General: No tenderness.     Right lower leg: Edema (trace) present.     Left lower leg: Edema (1+) present.  Skin:    General: Skin is warm and dry.  Neurological:     Mental Status: She is alert and oriented to person, place, and time.     Labs reviewed: Basic Metabolic Panel: Recent Labs    12/16/17 0914 09/11/18 0927  NA 142 143  K 4.6 4.7  CL 105 106  CO2 30 30  GLUCOSE 85 93  BUN 15 14  CREATININE 0.80 0.89  CALCIUM 9.9 10.1  TSH 3.65  --    Liver Function Tests: Recent Labs    12/16/17 0914 09/11/18 0927  AST 15 20  ALT 10 18  BILITOT 0.6 0.6  PROT 6.7 6.7   No results for input(s): LIPASE, AMYLASE in the last 8760 hours. No results for input(s): AMMONIA in the last 8760 hours. CBC: Recent Labs    12/16/17 0914 09/11/18 0927 10/24/18 0935  WBC 4.4 4.6 5.0  NEUTROABS 2,143 2,788 2,575  HGB 13.5 12.8 12.5  HCT 39.2 38.4 37.4  MCV 92.9 93.7 91.7  PLT 251 273 339   Lipid Panel: Recent Labs    12/16/17 0914 03/07/18 0822 09/11/18 0927  CHOL 225* 156 165  HDL 48* 59 62  LDLCALC  139* 77 80  TRIG 233* 112 136  CHOLHDL 4.7 2.6 2.7   TSH: Recent Labs    12/16/17 0914  TSH 3.65   A1C: No results found for: HGBA1C   Assessment/Plan 1. Memory loss -daughter does not wish to have any imagining done until consulted with a specialist despite giving her education on disease process and need for CT for further evaluation. Labs have been evaluated and wnl. MMSE 25/30 on 10/01/2018 - Ambulatory referral to Neurology for evaluation and neuropsych testing.   2. Peripheral venous insufficiency -noted mostly to left lower leg where she is wearing brace -states she does not need brace so will try to remove it to see if this improves symptoms -also discussed low sodium diet which she does -elevation of legs when sitting and having them above level of the heart in afternoon -compression hose during the day  3. Elevated blood pressure reading without hypertension Improved on recheck. Pt reports it is always good when she is at home and home health has taken it or she has checked it.  Suspect white coat syndrome.  Encouraged to take at home and bring readings to office when she comes for further appointments.   Next appt: 3 months.  Janene HarveyJessica K. Biagio BorgEubanks, AGNP  [facility] [phone]

## 2018-11-27 ENCOUNTER — Encounter: Payer: Self-pay | Admitting: Nurse Practitioner

## 2018-11-27 ENCOUNTER — Telehealth: Payer: Self-pay

## 2018-11-27 DIAGNOSIS — F0390 Unspecified dementia without behavioral disturbance: Secondary | ICD-10-CM | POA: Insufficient documentation

## 2018-11-27 NOTE — Telephone Encounter (Signed)
Spoke with Vanessa Chavez informed Vanessa Chavez that no paperwork received and asked if she can refax.   Vanessa Chavez asked for verbal orders for Speech Therapy, Social Worker, and confirmation of dementia diagnosis or mild cognitive impairment).  I gave the verbal order for [facility] and Education officer, museum. I informed Vanessa Chavez that I will need to consult with Lauree Chandler, NP about dementia diagnosis for her problem list states memory loss and does not specifically say dementia.  Vanessa Chavez stated that patient would need to have a diagnosis or dementia or mild cognitive impairment to assure approval of services.  Lauree Chandler, NP please advise

## 2018-11-27 NOTE — Telephone Encounter (Signed)
Vanessa Chavez with [facility] called to inform Vanessa Chandler, NP that patient with safety concerns. Patient found several times in the middle of the night up the street. Vanessa Chavez has spoken with patients daughter and recommended memory care.   Vanessa Chavez would also like to confirm orders received via fax from 3 days ago.   Vanessa Billow do you recall seeing Home Health orders on patient? I checked your review and sign folder along with Dr.Reed's and did not locate any incoming orders.

## 2018-11-27 NOTE — Telephone Encounter (Signed)
Diagnosis is dementia, will add to problem list

## 2018-11-27 NOTE — Telephone Encounter (Signed)
Left detailed message on confidential voicemail for Sonia Baller with Jessica's response

## 2018-11-27 NOTE — Telephone Encounter (Signed)
No I do not recall seeing anything to sign but there was information on the daughter not wanting to reschedule the patients lunch time to accommodate the home health times

## 2018-12-01 ENCOUNTER — Encounter: Payer: Self-pay | Admitting: Neurology

## 2018-12-19 ENCOUNTER — Encounter: Payer: Self-pay | Admitting: Family

## 2018-12-19 ENCOUNTER — Ambulatory Visit: Payer: Self-pay

## 2018-12-26 ENCOUNTER — Encounter

## 2018-12-26 ENCOUNTER — Ambulatory Visit: Payer: Medicare Other | Admitting: Neurology

## 2019-02-04 ENCOUNTER — Ambulatory Visit: Payer: Medicare Other | Admitting: Neurology

## 2019-02-24 DIAGNOSIS — E785 Hyperlipidemia, unspecified: Secondary | ICD-10-CM

## 2019-02-24 DIAGNOSIS — F0391 Unspecified dementia with behavioral disturbance: Secondary | ICD-10-CM

## 2019-02-24 DIAGNOSIS — Z9181 History of falling: Secondary | ICD-10-CM

## 2019-02-24 DIAGNOSIS — I2510 Atherosclerotic heart disease of native coronary artery without angina pectoris: Secondary | ICD-10-CM

## 2019-02-24 DIAGNOSIS — D649 Anemia, unspecified: Secondary | ICD-10-CM

## 2019-03-03 ENCOUNTER — Telehealth: Payer: Self-pay | Admitting: Nurse Practitioner

## 2019-03-03 NOTE — Telephone Encounter (Signed)
Left message asking patient to call us back to schedule AWV - told her that it could be a telephone visit if she preferred, just to let us know.

## 2019-03-19 ENCOUNTER — Other Ambulatory Visit: Payer: Self-pay | Admitting: Family Medicine

## 2019-03-19 DIAGNOSIS — F0281 Dementia in other diseases classified elsewhere with behavioral disturbance: Secondary | ICD-10-CM

## 2019-03-19 DIAGNOSIS — G309 Alzheimer's disease, unspecified: Secondary | ICD-10-CM

## 2019-04-04 ENCOUNTER — Ambulatory Visit
Admission: RE | Admit: 2019-04-04 | Discharge: 2019-04-04 | Disposition: A | Discharge status: Home / Self Care | Payer: Medicare Other | Source: Ambulatory Visit | Attending: Family Medicine | Admitting: Family Medicine

## 2019-04-04 ENCOUNTER — Other Ambulatory Visit: Payer: Self-pay

## 2019-04-04 DIAGNOSIS — G309 Alzheimer's disease, unspecified: Secondary | ICD-10-CM

## 2019-04-04 DIAGNOSIS — F0281 Dementia in other diseases classified elsewhere with behavioral disturbance: Secondary | ICD-10-CM

## 2019-04-04 IMAGING — MR MR HEAD W/O CM
10 series · 48 of 48 positions shown · non-contrast
Comparison: None.

CLINICAL DATA: Alzheimer's dementia with behavioral disturbance

EXAM:
MRI HEAD WITHOUT CONTRAST
TECHNIQUE: Multiplanar, multiecho pulse sequences of the brain and surrounding
structures were obtained without intravenous contrast.

[Series 2: T1 · sagittal · 5.0mm · 0.45mm/px · 2 of 21 slices shown]
[im 1/21]
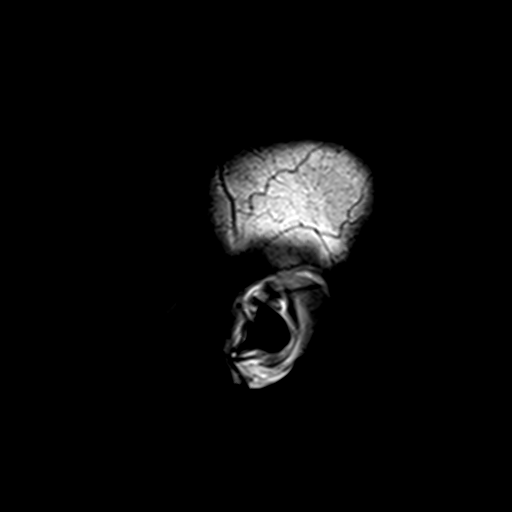
[im 21/21]
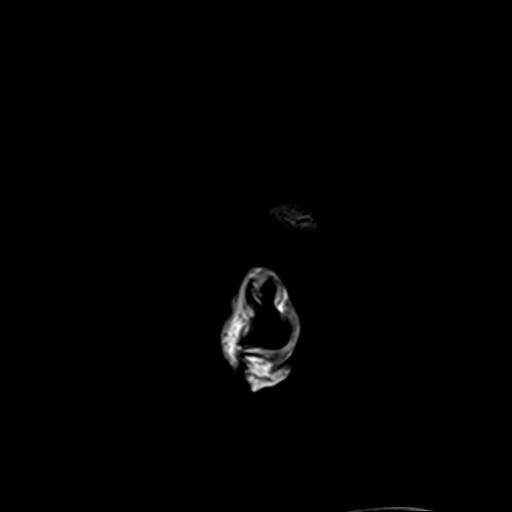

[Series 3: DWI · axial · 3.0mm · 1.80mm/px · z∈[-57,+89]mm · 9 of 100 slices shown (1 of 4)]
[im 1/100]
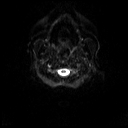
[im 13/100]
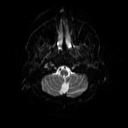
[im 25/100]
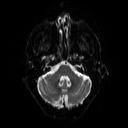
[im 38/100]
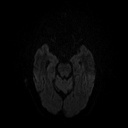
[im 50/100]
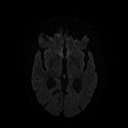
[im 62/100]
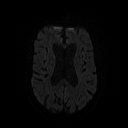
[im 75/100]
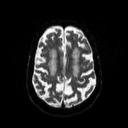
[im 87/100]
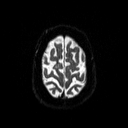
[im 100/100]
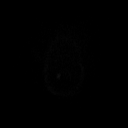

[Series 4: DWI · axial · 3.0mm · 1.80mm/px · z∈[-57,+89]mm · 5 of 50 slices shown (2 of 4)]
[im 1/50]
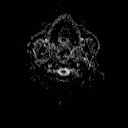
[im 13/50]
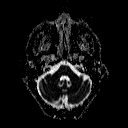
[im 25/50]
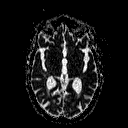
[im 37/50]
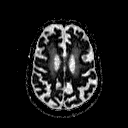
[im 50/50]
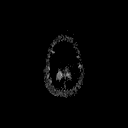

[Series 5: DWI · coronal · 5.0mm · 1.80mm/px · 6 of 67 slices shown (3 of 4)]
[im 1/67]
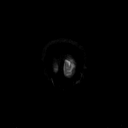
[im 14/67]
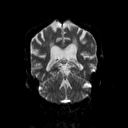
[im 27/67]
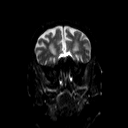
[im 40/67]
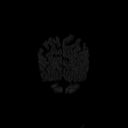
[im 53/67]
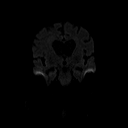
[im 67/67]
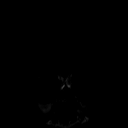

[Series 6: DWI · coronal · 5.0mm · 1.80mm/px · 3 of 34 slices shown (4 of 4)]
[im 1/34]
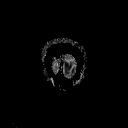
[im 17/34]
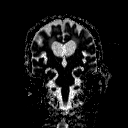
[im 34/34]
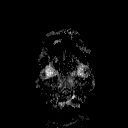

[Series 7: T2 · axial · 5.0mm · 0.60mm/px · z∈[-56,+90]mm · 2 of 22 slices shown (1 of 2)]
[im 1/22]
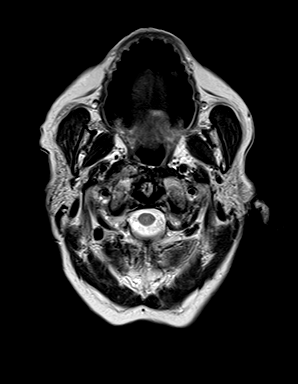
[im 22/22]
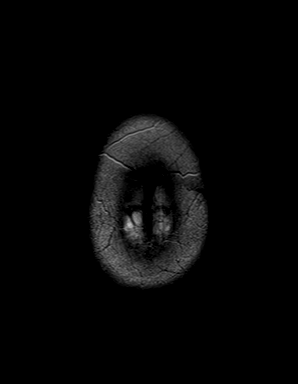

[Series 8: FLAIR · axial · 3.0mm · 0.45mm/px · z∈[-51,+83]mm · 3 of 30 slices shown]
[im 1/30]
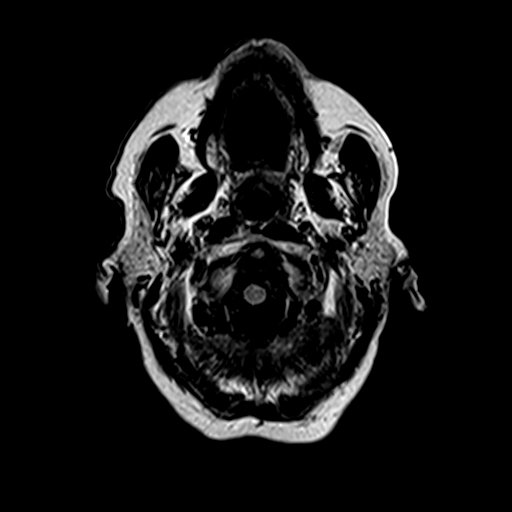
[im 15/30]
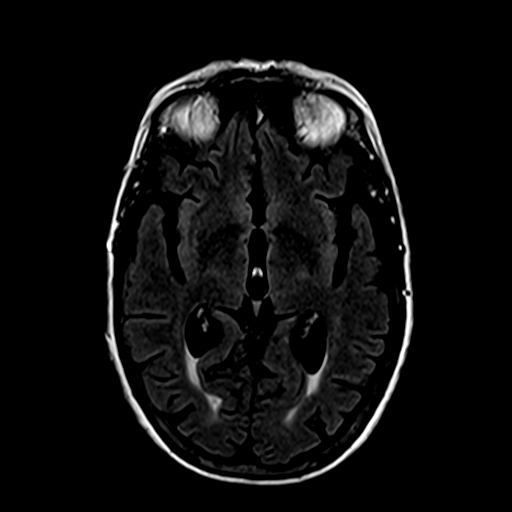
[im 30/30]
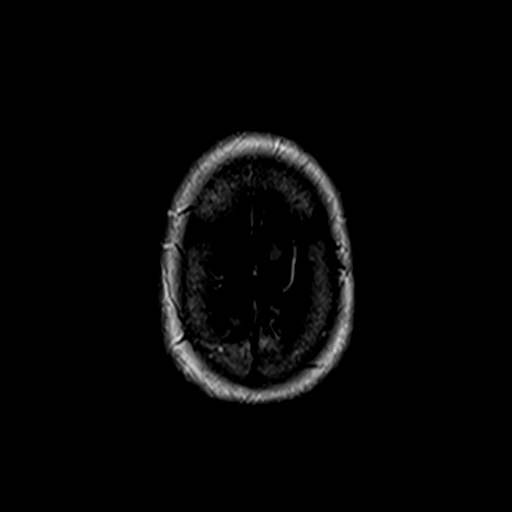

[Series 10: swi_images · axial · 4.0mm · 0.90mm/px · z∈[-53,+85]mm · 3 of 36 slices shown]
[im 1/36]
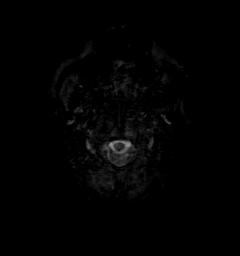
[im 18/36]
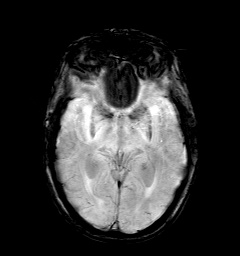
[im 36/36]
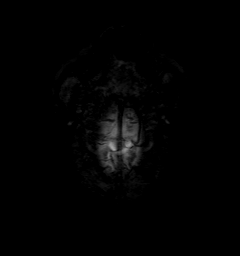

[Series 11: t1_mpr_tra · axial · 1.0mm · 0.71mm/px · z∈[-54,+88]mm · 13 of 144 slices shown]
[im 1/144]
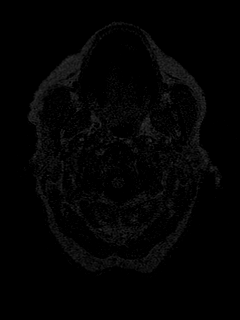
[im 12/144]
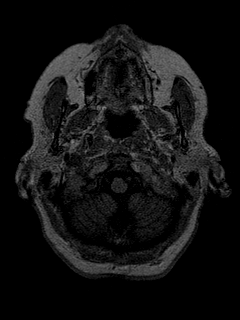
[im 24/144]
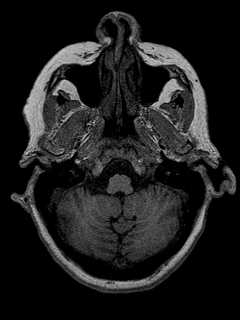
[im 36/144]
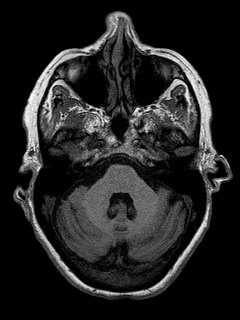
[im 48/144]
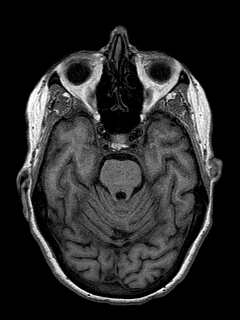
[im 60/144]
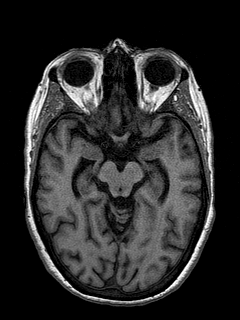
[im 72/144]
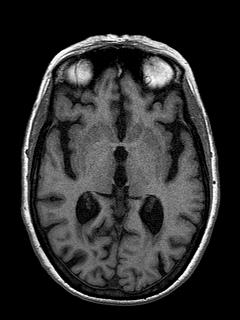
[im 84/144]
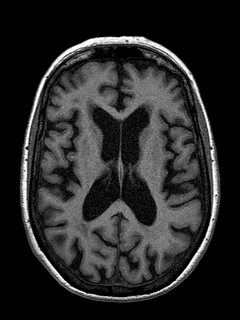
[im 96/144]
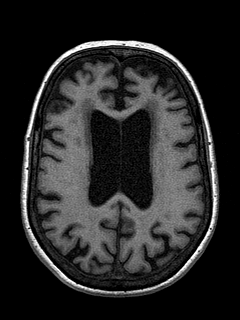
[im 108/144]
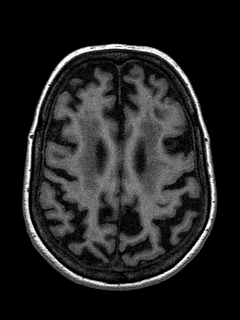
[im 120/144]
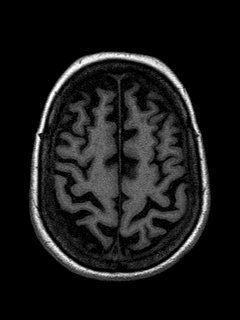
[im 132/144]
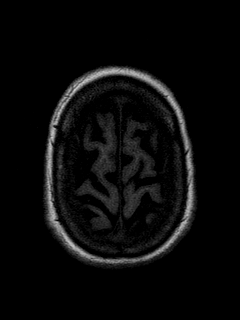
[im 144/144]
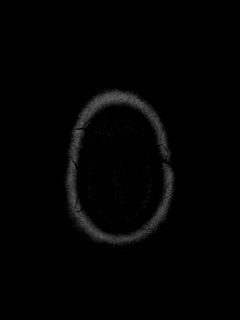

[Series 12: T2 · coronal · 5.0mm · 0.45mm/px · 2 of 25 slices shown (2 of 2)]
[im 1/25]
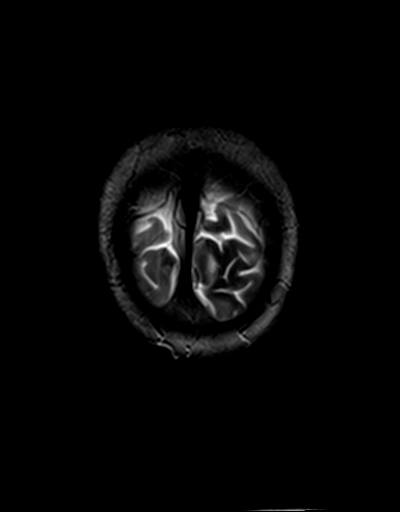
[im 25/25]
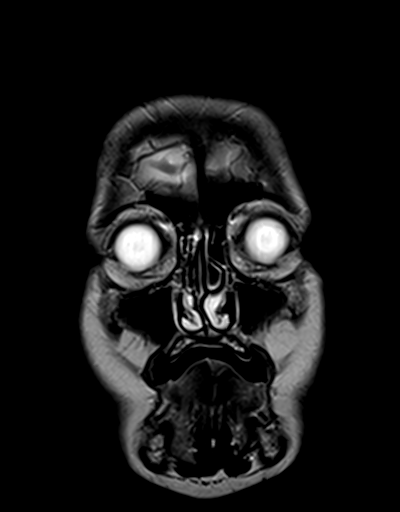

[48 of 48 positions shown; findings below may reference images not displayed]

FINDINGS: Brain: Mild to moderate generalized atrophy. Mild ventricular
enlargement consistent with atrophy.

Negative for acute infarct. Mild to moderate chronic microvascular
ischemic change in the white matter. Negative for hemorrhage or
mass.

Vascular: Normal arterial flow voids.

Skull and upper cervical spine: No focal skeletal lesion.

Sinuses/Orbits: Paranasal sinuses clear. Bilateral cataract
extraction

Other: None
IMPRESSION: No acute abnormality

Mild to moderate atrophy and chronic microvascular ischemia.

## 2020-04-20 ENCOUNTER — Observation Stay (HOSPITAL_COMMUNITY)
Admission: EM | Admit: 2020-04-20 | Discharge: 2020-04-21 | Disposition: A | Discharge status: Home / Self Care | Payer: Medicare HMO | Attending: Internal Medicine | Admitting: Internal Medicine

## 2020-04-20 ENCOUNTER — Other Ambulatory Visit: Payer: Self-pay

## 2020-04-20 ENCOUNTER — Encounter (HOSPITAL_COMMUNITY): Payer: Self-pay

## 2020-04-20 ENCOUNTER — Emergency Department (HOSPITAL_COMMUNITY): Payer: Medicare HMO

## 2020-04-20 DIAGNOSIS — Z79899 Other long term (current) drug therapy: Secondary | ICD-10-CM | POA: Insufficient documentation

## 2020-04-20 DIAGNOSIS — G309 Alzheimer's disease, unspecified: Secondary | ICD-10-CM | POA: Diagnosis not present

## 2020-04-20 DIAGNOSIS — Z87891 Personal history of nicotine dependence: Secondary | ICD-10-CM | POA: Diagnosis not present

## 2020-04-20 DIAGNOSIS — R55 Syncope and collapse: Principal | ICD-10-CM | POA: Diagnosis present

## 2020-04-20 DIAGNOSIS — F0390 Unspecified dementia without behavioral disturbance: Secondary | ICD-10-CM | POA: Diagnosis not present

## 2020-04-20 DIAGNOSIS — Z20822 Contact with and (suspected) exposure to covid-19: Secondary | ICD-10-CM | POA: Diagnosis not present

## 2020-04-20 DIAGNOSIS — I2510 Atherosclerotic heart disease of native coronary artery without angina pectoris: Secondary | ICD-10-CM | POA: Diagnosis not present

## 2020-04-20 DIAGNOSIS — E785 Hyperlipidemia, unspecified: Secondary | ICD-10-CM

## 2020-04-20 LAB — COMPREHENSIVE METABOLIC PANEL
ALT: 15 U/L (ref 0–44)
AST: 17 U/L (ref 15–41)
Albumin: 4.1 g/dL (ref 3.5–5.0)
Alkaline Phosphatase: 59 U/L (ref 38–126)
Anion gap: 9 (ref 5–15)
BUN: 13 mg/dL (ref 8–23)
CO2: 23 mmol/L (ref 22–32)
Calcium: 9.5 mg/dL (ref 8.9–10.3)
Chloride: 108 mmol/L (ref 98–111)
Creatinine, Ser: 0.82 mg/dL (ref 0.44–1.00)
GFR, Estimated: >60 mL/min (ref >60)
Glucose, Bld: 98 mg/dL (ref 70–99)
Potassium: 3.8 mmol/L (ref 3.5–5.1)
Sodium: 140 mmol/L (ref 135–145)
Total Bilirubin: 0.7 mg/dL (ref 0.3–1.2)
Total Protein: 6.8 g/dL (ref 6.5–8.1)

## 2020-04-20 LAB — CBC
HCT: 40.8 % (ref 36.0–46.0)
Hemoglobin: 13.2 g/dL (ref 12.0–15.0)
MCH: 31 pg (ref 26.0–34.0)
MCHC: 32.4 g/dL (ref 30.0–36.0)
MCV: 95.8 fL (ref 80.0–100.0)
Platelets: 275 10*3/uL (ref 150–400)
RBC: 4.26 MIL/uL (ref 3.87–5.11)
RDW: 12.4 % (ref 11.5–15.5)
WBC: 10.1 10*3/uL (ref 4.0–10.5)
nRBC: 0 % (ref 0.0–0.2)

## 2020-04-20 LAB — TROPONIN I (HIGH SENSITIVITY)
Troponin I (High Sensitivity): 3 ng/L (ref <18)
Troponin I (High Sensitivity): 4 ng/L (ref <18)

## 2020-04-20 LAB — CBG MONITORING, ED: Glucose-Capillary: 92 mg/dL (ref 70–99)

## 2020-04-20 LAB — SARS CORONAVIRUS 2 (TAT 6-24 HRS): SARS Coronavirus 2: NEGATIVE

## 2020-04-20 LAB — MAGNESIUM: Magnesium: 2 mg/dL (ref 1.7–2.4)

## 2020-04-20 LAB — PHOSPHORUS: Phosphorus: 2.9 mg/dL (ref 2.5–4.6)

## 2020-04-20 IMAGING — MR MR HEAD W/O CM
9 of 13 series · 30 of 48 positions shown · non-contrast
Comparison: [DATE]

CLINICAL DATA: Syncopal episode. Unresponsive at the breakfast
table.

EXAM:
MRI HEAD WITHOUT CONTRAST
TECHNIQUE: Multiplanar, multiecho pulse sequences of the brain and surrounding
structures were obtained without intravenous contrast.

[Series 2: DWI · axial · 3.0mm · 0.94mm/px · z∈[-104,+42]mm · 7 of 100 slices shown (1 of 4)]
[im 1/100]
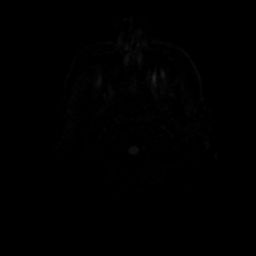
[im 17/100]
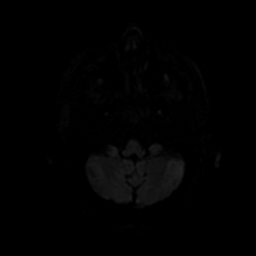
[im 34/100]
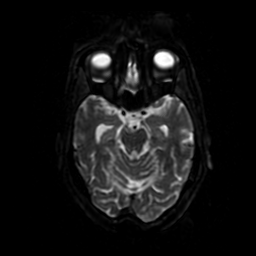
[im 50/100]
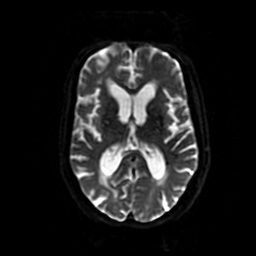
[im 67/100]
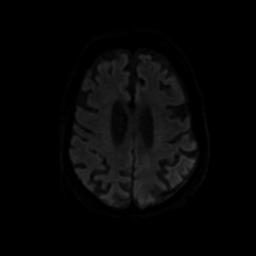
[im 83/100]
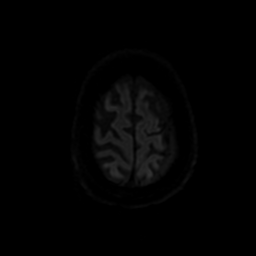
[im 100/100]
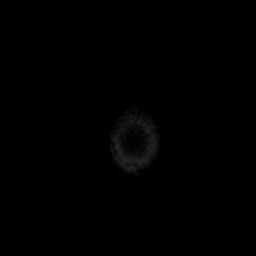

[Series 3: DWI · coronal · 4.0mm · 0.94mm/px · 4 of 72 slices shown (2 of 4)]
[im 1/72]
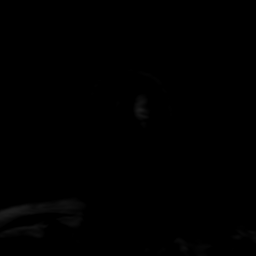
[im 24/72]
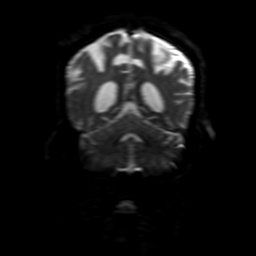
[im 48/72]
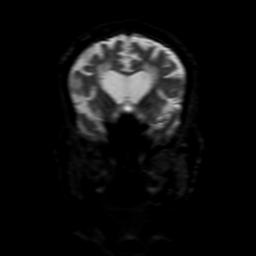
[im 72/72]
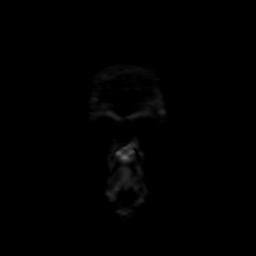

[Series 4: FLAIR · sagittal · 5.0mm · 0.23mm/px · 1 of 23 slices shown (1 of 2)]
[im 1/23]
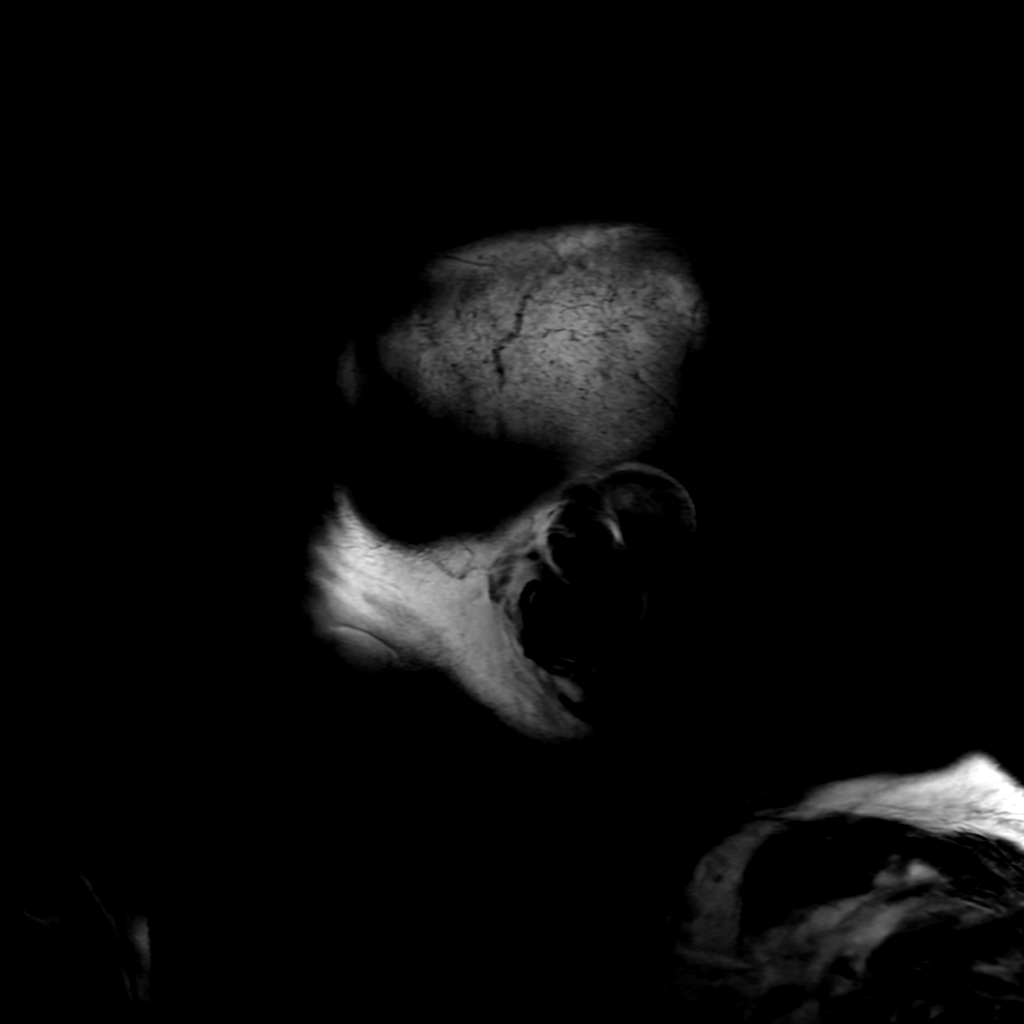

[Series 5: T2 · axial · 5.0mm · 0.23mm/px · 1 of 26 slices shown]
[im 1/26]
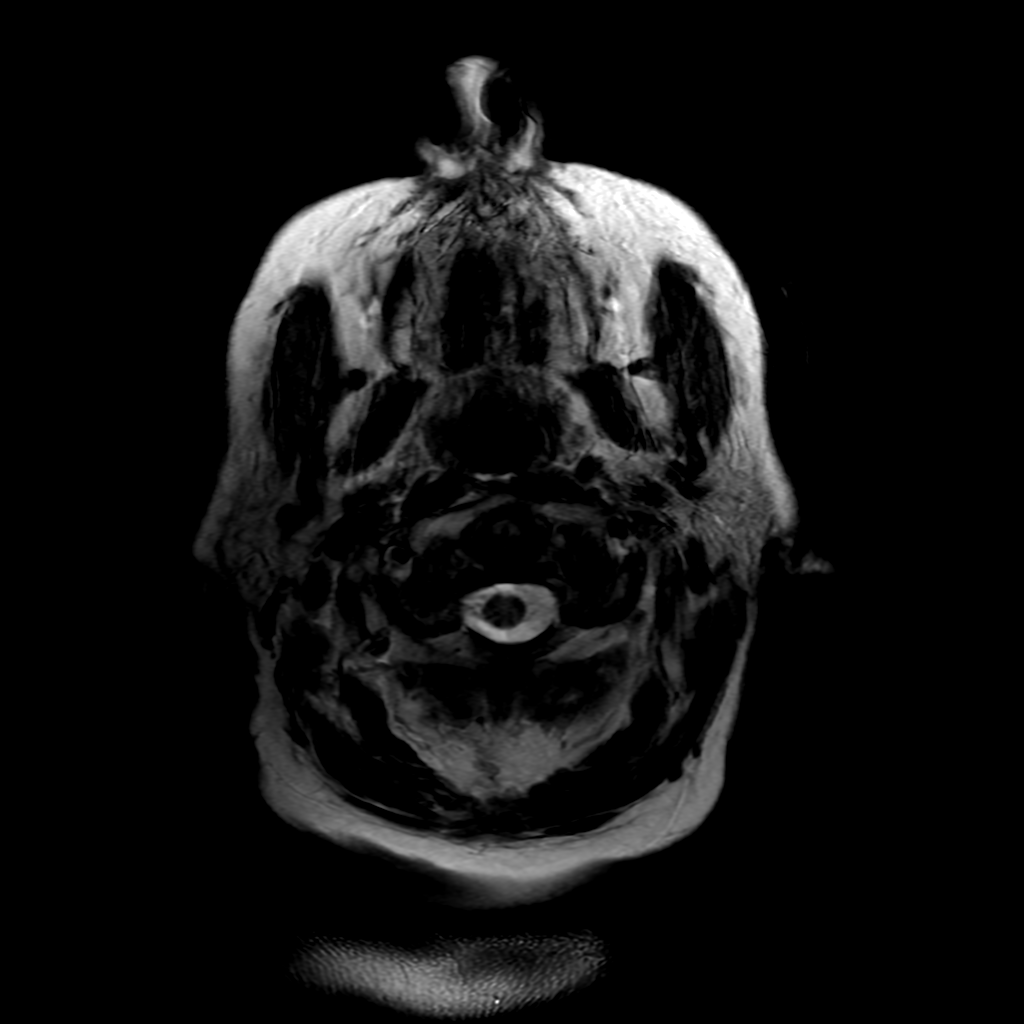

[Series 6: FLAIR · axial · 5.0mm · 0.47mm/px · z∈[-110,+39]mm · 2 of 26 slices shown (2 of 2)]
[im 1/26]
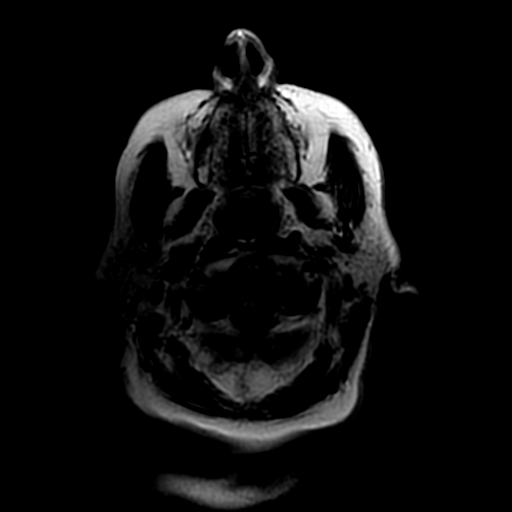
[im 26/26]
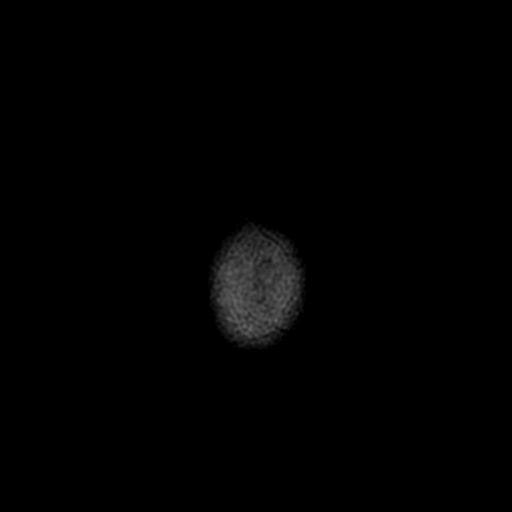

[Series 210: DWI · axial · 3.0mm · 0.94mm/px · z∈[-104,+42]mm · 6 of 100 slices shown (3 of 4)]
[im 1/100]
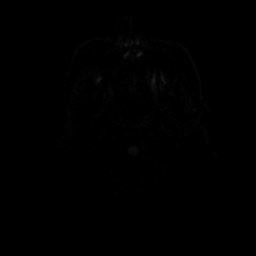
[im 20/100]
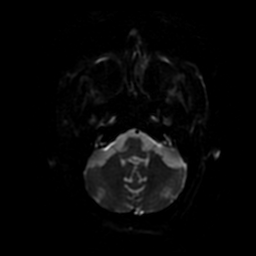
[im 40/100]
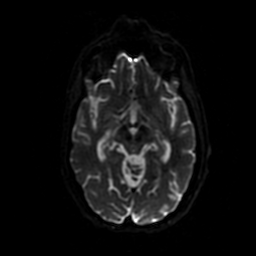
[im 60/100]
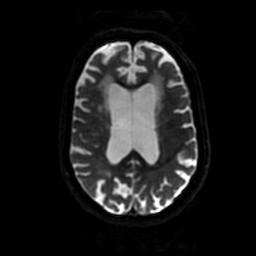
[im 80/100]
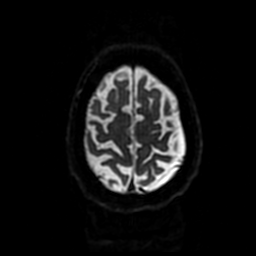
[im 100/100]
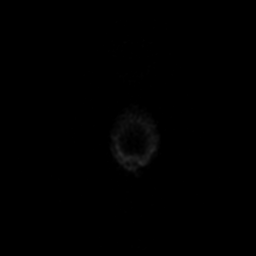

[Series 250: ADC · axial · 3.0mm · 0.94mm/px · z∈[-104,+42]mm · 3 of 50 slices shown (1 of 2)]
[im 1/50]
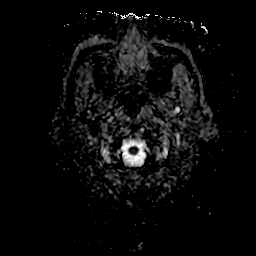
[im 25/50]
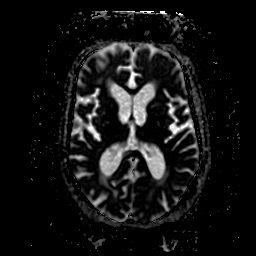
[im 50/50]
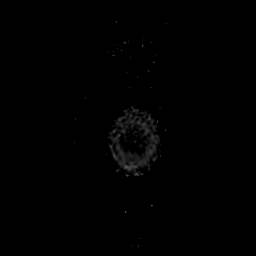

[Series 310: DWI · coronal · 4.0mm · 0.94mm/px · 4 of 72 slices shown (4 of 4)]
[im 1/72]
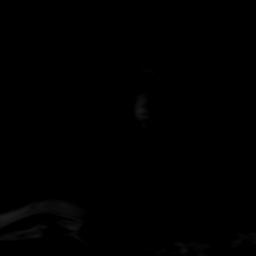
[im 24/72]
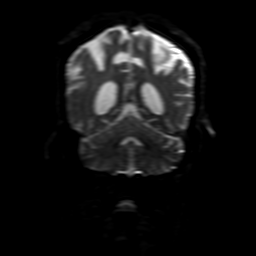
[im 48/72]
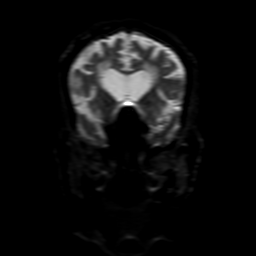
[im 72/72]
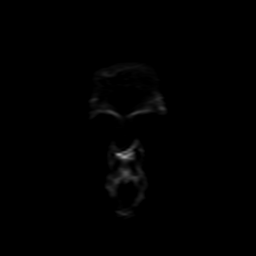

[Series 350: ADC · coronal · 4.0mm · 0.94mm/px · 2 of 36 slices shown (2 of 2)]
[im 1/36]
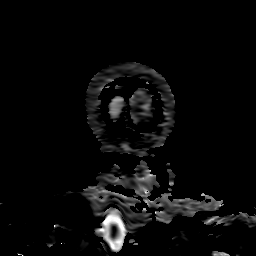
[im 36/36]
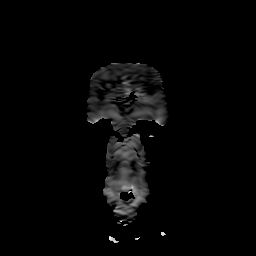

[30 of 48 positions shown; findings below may reference images not displayed]

FINDINGS: Brain: Diffusion imaging does not show any acute or subacute
infarction. The brainstem and cerebellum are unremarkable. Cerebral
hemispheres show chronic atrophy with moderate to marked chronic
small-vessel ischemic change of the deep white matter. No cortical
or large vessel territory infarction. There is a subdural fluid
collection on the left along the convexity with maximal thickness of
5 mm. This does not appear to represent acute hemorrhage but is
probably a chronic subdural hygroma. No significant mass effect. One
or 2 mm of right-to-left midline shift. Chronic ventriculomegaly is
stable.

Vascular: Major vessels at the base of the brain show flow.

Skull and upper cervical spine: Negative

Sinuses/Orbits: Clear/normal

Other: None
IMPRESSION: 1. No acute stroke. Generalized atrophy with moderate to marked
chronic small-vessel ischemic change of the cerebral hemispheric
deep white matter.
2. Chronic appearing 5 mm subdural fluid collection on the left
along the convexity, most likely a chronic subdural hygroma. No
significant mass effect. One or 2 mm of right-to-left midline shift.
This was not present on the prior study [DATE].

## 2020-04-20 MED ORDER — PRAVASTATIN SODIUM 10 MG PO TABS
20.0000 mg | ORAL_TABLET | Freq: Every day | ORAL | Status: DC
  Administered 2020-04-21: 20 mg via ORAL
  Filled 2020-04-20 (×2): qty 2

## 2020-04-20 MED ORDER — ACETAMINOPHEN 325 MG PO TABS: 650.0000 mg | ORAL_TABLET | Freq: Four times a day (QID) | ORAL | Status: DC | PRN

## 2020-04-20 MED ORDER — POLYETHYLENE GLYCOL 3350 17 G PO PACK: 17.0000 g | PACK | Freq: Every day | ORAL | Status: DC | PRN

## 2020-04-20 MED ORDER — LORAZEPAM 2 MG/ML IJ SOLN
0.5000 mg | Freq: Once | INTRAMUSCULAR | Status: AC
  Administered 2020-04-20: 0.5 mg via INTRAVENOUS
  Filled 2020-04-20: qty 1

## 2020-04-20 MED ORDER — ENOXAPARIN SODIUM 40 MG/0.4ML ~~LOC~~ SOLN
40.0000 mg | Freq: Every day | SUBCUTANEOUS | Status: DC
  Administered 2020-04-20: 40 mg via SUBCUTANEOUS
  Filled 2020-04-20: qty 0.4

## 2020-04-20 MED ORDER — LACTATED RINGERS IV SOLN: INTRAVENOUS | Status: DC

## 2020-04-20 MED ORDER — ACETAMINOPHEN 650 MG RE SUPP: 650.0000 mg | Freq: Four times a day (QID) | RECTAL | Status: DC | PRN

## 2020-04-20 NOTE — ED Notes (Signed)
Pt back from MRI 

## 2020-04-20 NOTE — ED Notes (Signed)
Pt transported to MRI 

## 2020-04-20 NOTE — Hospital Course (Addendum)
   No current facility-administered medications on file prior to encounter.   Current Outpatient Medications on File Prior to Encounter  Medication Sig Dispense Refill   Ascorbic Acid (VITAMIN C) 1000 MG tablet Take 1,000 mg daily by mouth.     B Complex-C-Folic Acid (STRESS B COMPLEX PO) Take by mouth.     CALCIUM PO Take by mouth. 1200 mg tablets , she takes 2 daily at one time     Cholecalciferol (VITAMIN D3) 125 MCG (5000 UT) CAPS Take 1 capsule by mouth daily.     donepezil (ARICEPT) 10 MG tablet TAKE 1 TABLET BY MOUTH AT BEDTIME 30 tablet 5   Garlic 1000 MG CAPS Take by mouth.     memantine (NAMENDA TITRATION PACK) tablet pack Take by mouth See admin instructions. 5 mg/day for =1 week; 5 mg twice daily for =1 week; Then 10 mg in the am and 5 mg in the pm  for =1 week; then 10 mg twice daily (Patient not taking: Reported on 11/26/2018) 49 tablet 0   Multiple Vitamin (MULTIVITAMIN) capsule Take 1 capsule daily by mouth.     simvastatin (ZOCOR) 20 MG tablet Take 1 tablet (20 mg total) by mouth daily. 90 tablet 1        Past Medical History:  Diagnosis Date   CAD (coronary artery disease)    Collagenous colitis    Female bladder prolapse    Herpes zoster    Hyperlipidemia    Normocytic anemia    Osteoarthritis    Osteopenia    Right knee meniscal tear    Senile purpura ([facility])

## 2020-04-20 NOTE — H&P (Signed)
Date: 04/20/2020               Patient Name:  Vanessa Chavez MRN: [id number]  DOB: 01-Apr-1940 Age / Sex: 80 y.o., female   PCP: No primary care provider on file.         Medical Service: Internal Medicine Teaching Service         Attending Physician: Dr. Earl Lagos, MD    First Contact: Thalia Bloodgood, DO Pager: Theresa Duty [phone]  Second Contact: Dellia Cloud, DO Pager: [facility] [phone]       After Hours (After 5p/  First Contact Pager: [phone]  weekends / holidays): Second Contact Pager: [phone]   SUBJECTIVE   Chief Complaint: Syncope   History of Present Illness: Vanessa Chavez is a 80 y.o. female with a pertinent PMH of osteoporosis, hyperlipidemia, carotid artery disease, anemia, dementia, who presents to [facility] after a syncopal episode.  Patient presents to [facility] on her nursing facility after an acute syncopal episode.  EDP states that the patient was found unresponsive at the breakfast table with her face in her food.  The patient was difficult to arouse and was found to be hypertensive.  On route, EMS noted her SBP to be significantly decreased at 62.  There is some concern that the patient has had increased number of falls over the last month, which was attributed to the patient's pacing and wandering without using her Rollator.  Otherwise, the patient has a difficult time participating with the interview due to her end-stage dementia.  Medications:  No outpatient medications have been marked as taking for the 04/20/20 encounter [facility] Encounter).    Social:  Lives - [place] Occupation - retired Building surveyor - facilty Level of function -full assist with ADLs and IADLs PCP -unknown Substance use -none  Family History: Family History  Problem Relation Age of Onset  . Pneumonia Mother 34  . Hypertension Mother   . Cirrhosis Father 74       liver  . Cancer - Lung Father   . Cancer Brother        sinuses  . Suicidality Son   . Heart Problems Maternal  Grandmother 47     Allergies: Allergies as of 04/20/2020  . (No Known Allergies)   Past Medical History:  Diagnosis Date  . CAD (coronary artery disease)   . Collagenous colitis   . Female bladder prolapse   . Herpes zoster   . Hyperlipidemia   . Normocytic anemia   . Osteoarthritis   . Osteopenia   . Right knee meniscal tear   . Senile purpura ([facility])     Review of Systems: A complete ROS was negative except as per HPI.   OBJECTIVE:   Physical Exam: Blood pressure 138/89, pulse 61, temperature (!) 97.4 F (36.3 C), temperature source Oral, resp. rate 14, SpO2 98 %. Physical Exam Constitutional:      General: She is not in acute distress.    Appearance: She is not diaphoretic.  HENT:     Head: Normocephalic.     Comments: Periorbital bruising on right eye Eyes:     Extraocular Movements: Extraocular movements intact.  Cardiovascular:     Rate and Rhythm: Normal rate.     Pulses: Normal pulses.     Heart sounds: Normal heart sounds.  Pulmonary:     Effort: Pulmonary effort is normal.     Breath sounds: Normal breath sounds.  Abdominal:     General: Bowel sounds  are normal. There is no distension.     Tenderness: There is no abdominal tenderness.  Musculoskeletal:        General: No swelling or deformity. Normal range of motion.  Skin:    General: Skin is warm and dry.  Neurological:     General: No focal deficit present.     Mental Status: She is alert. Mental status is at baseline. She is disoriented.     Motor: Weakness (generlaized ) present.  Psychiatric:        Cognition and Memory: Cognition is impaired.     Labs: CBC    Component Value Date/Time   WBC 10.1 04/20/2020 1032   RBC 4.26 04/20/2020 1032   HGB 13.2 04/20/2020 1032   HCT 40.8 04/20/2020 1032   PLT 275 04/20/2020 1032   MCV 95.8 04/20/2020 1032   MCH 31.0 04/20/2020 1032   MCHC 32.4 04/20/2020 1032   RDW 12.4 04/20/2020 1032   LYMPHSABS 1,405 10/24/2018 0935   EOSABS 330  10/24/2018 0935   BASOSABS 80 10/24/2018 0935     CMP     Component Value Date/Time   NA 140 04/20/2020 1032   NA 138 11/27/2016 0000   K 3.8 04/20/2020 1032   CL 108 04/20/2020 1032   CO2 23 04/20/2020 1032   GLUCOSE 98 04/20/2020 1032   BUN 13 04/20/2020 1032   BUN 10 11/27/2016 0000   CREATININE 0.82 04/20/2020 1032   CREATININE 0.89 09/11/2018 0927   CALCIUM 9.5 04/20/2020 1032   PROT 6.8 04/20/2020 1032   ALBUMIN 4.1 04/20/2020 1032   AST 17 04/20/2020 1032   ALT 15 04/20/2020 1032   ALKPHOS 59 04/20/2020 1032   BILITOT 0.7 04/20/2020 1032   GFRNONAA >60 04/20/2020 1032   GFRNONAA 62 09/11/2018 0927   GFRAA 71 09/11/2018 0927    Imaging: MR BRAIN WO CONTRAST 1. No acute stroke. Generalized atrophy with moderate to marked chronic small-vessel ischemic change of the cerebral hemispheric deep white matter.  2. 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 No significant mass effect. One or 2 mm of right-to-left midline shift. This was not present on the prior study of February 2021.    EKG: personally reviewed my interpretation is significant back ground. Pending repeat.   ASSESSMENT & PLAN:   Active Problems:   Vasovagal syncope   Syncope   Vanessa Chavez is a 80 y.o. with pertinent PMH of osteoporosis, hyperlipidemia, carotid artery disease, anemia, dementia, who presented after syncope.  #Syncope: Patient presents after an acute syncopal episode at her memory care units.  Considering her blood pressure was low during her syncopal episode, it is likely related to orthostatic hypotension. Her daughter denies any past history of structural heart disease.  Patient does have several centrally acting medications including quetiapine 25 mg and sertraline 50 mg which could be contributing to her syncopal episode.  The EDP was concerned the patient may be having an underlying arrhythmia, although  is difficult to discern from the EKG.  We will repeat EKG and keep on telemetry overnight to see if she is having any further arrhythmias.  Furthermore, we will perform orthostatic vitals on admission and tomorrow morning to see if she is continued to have orthostatic hypotension.  Patient patient is asymptomatic and pleasantly demented -Repeat EKG and continue telemetry -We will hold centrally acting medications and reassess tomorrow. -Orthostatic vitals on admission and tomorrow -We will  hold off on echocardiogram due to no previous history of structural heart disease - Will perform bedside swallow eval and start diet.   #Dementia: Patient has end-stage dementia and is living at a memory care unit.  Spoke with patient's daughter states that the facility does have palliative/hospice care if need be in the future.   Diet: Normal VTE: Enoxaparin IVF: None,None Code: DNR  Prior to Admission Living Arrangement: memory care facilty Anticipated Discharge Location: memory care facility Barriers to Discharge: none  Dispo: Admit patient to Observation with expected length of stay less than 2 midnights.  Signed: Chari Manning, D.O.  Internal Medicine Resident, PGY-2 [facility]  Pager: [phone] 5:28 PM, 04/20/2020   Please contact the on call pager after 5 pm and on weekends at [phone].

## 2020-04-20 NOTE — ED Provider Notes (Signed)
[facility] EMERGENCY DEPARTMENT Provider Note   CSN: [id number] Arrival date & time: 04/20/20  3419     History Chief Complaint  Patient presents with   Loss of Consciousness    Vanessa Chavez is a 80 y.o. female.  HPI Patient is a resident of [facility] with history of Alzheimer's Dementia. She was at breakfast this morning in the dining room when staff noticed her laying face down on the table unresponsive. Chin was into chest, head down, he shook her and she was grunting, drooling. Staff called EMS. In the meantime staff laid her down on floor, legs ups, then laid her on side. BP was 82 systolic, EMS got there and measured 62/40. HR 50s.  In the ED initial EKG was concerning for Afib. Since then monitoring has showed NSR with occasional PACs. Repeat EKG pending. Initial troponin negative at 3. CBC and CMP within normal limits. CBG normal at 92, although this is after patient had a couple bites of food, patient could have been hypoglycemic causing syncope.   In the last month she has had a couple of falls, she used to be a Engineer, civil (consulting) in [place]. She walks fast and has sometimes been stumbling. She has hit her head a couple of times. She hit her right eye a couple of days ago, caught the edge of a shelf in her closet and hit her eye.   MR Brain is negative for a stroke; however, shows left-sided 23mm convex fluid collection that is new since February 2021. Read suggests chronic hygroma; however is concerning considering patient's recent falls.   Has had PRN Ativan for agitation, last had on Monday.      Past Medical History:  Diagnosis Date   CAD (coronary artery disease)    Collagenous colitis    Female bladder prolapse    Herpes zoster    Hyperlipidemia    Normocytic anemia    Osteoarthritis    Osteopenia    Right knee meniscal tear    Senile purpura (HCC)     Patient Active Problem List   Diagnosis Date Noted   Dementia (HCC)  11/27/2018   Collagenous colitis 03/27/2018   Osteopenia of multiple sites 03/27/2018   Memory loss 11/12/2017   Osteoporosis 11/12/2017   Hyperlipidemia 11/12/2017   Carotid artery disease (HCC) 11/12/2017   Anemia 11/12/2017    Past Surgical History:  Procedure Laterality Date   ABDOMINAL HYSTERECTOMY     BREAST EXCISIONAL BIOPSY     CATARACT EXTRACTION Right 04/2017   CATARACT EXTRACTION Left 05/2017     OB History   No obstetric history on file.     Family History  Problem Relation Age of Onset   Pneumonia Mother 29   Hypertension Mother    Cirrhosis Father 6       liver   Cancer - Lung Father    Cancer Brother        sinuses   Suicidality Son    Heart Problems Maternal Grandmother 48    Social History   Tobacco Use   Smoking status: Former Smoker    Packs/day: 1.50    Years: 40.00    Pack years: 60.00    Quit date: 2008    Years since quitting: 14.2   Smokeless tobacco: Never Used  Building services engineer Use: Never used  Substance Use Topics   Alcohol use: Yes    Comment: occasional   Drug use: No  Home Medications Prior to Admission medications   Medication Sig Start Date End Date Taking? Authorizing Provider  Ascorbic Acid (VITAMIN C) 1000 MG tablet Take 1,000 mg daily by mouth.    [provider]  B Complex-C-Folic Acid (STRESS B COMPLEX PO) Take by mouth.    [provider]  CALCIUM PO Take by mouth. 1200 mg tablets , she takes 2 daily at one time    [provider]  Cholecalciferol (VITAMIN D3) 125 MCG (5000 UT) CAPS Take 1 capsule by mouth daily.    [provider]  donepezil (ARICEPT) 10 MG tablet TAKE 1 TABLET BY MOUTH AT BEDTIME 11/24/18   Sharon Seller, NP  Garlic 1000 MG CAPS Take by mouth.    [provider]  memantine [facility] TITRATION PACK) tablet pack Take by mouth See admin instructions. 5 mg/day for =1 week; 5 mg twice daily for =1 week; Then 10 mg in the am  and 5 mg in the pm  for =1 week; then 10 mg twice daily Patient not taking: Reported on 11/26/2018 10/24/18   Sharon Seller, NP  Multiple Vitamin (MULTIVITAMIN) capsule Take 1 capsule daily by mouth.    [provider]  simvastatin (ZOCOR) 20 MG tablet Take 1 tablet (20 mg total) by mouth daily. 10/24/18   Sharon Seller, NP   Allergies    Patient has no known allergies.  Review of Systems   Review of Systems - pan negative, patient is poor historian  Physical Exam Updated Vital Signs BP 125/65    Pulse 62    Temp (!) 97.4 F (36.3 C) (Oral)    Resp 17    SpO2 99%   Physical Exam Constitutional:      Appearance: She is not ill-appearing or toxic-appearing.  HENT:     Head: Normocephalic.     Right Ear: Tympanic membrane normal.     Left Ear: Tympanic membrane normal.     Nose: Nose normal.     Mouth/Throat:     Mouth: Mucous membranes are moist.  Eyes:     Extraocular Movements: Extraocular movements intact.     Conjunctiva/sclera: Conjunctivae normal.     Comments: Healing ecchymosis to patient's R eye  Cardiovascular:     Rate and Rhythm: Bradycardia present. Rhythm irregular.     Pulses: Normal pulses.     Heart sounds: Normal heart sounds.  Pulmonary:     Effort: Pulmonary effort is normal.     Breath sounds: Normal breath sounds.  Abdominal:     General: Bowel sounds are normal.  Musculoskeletal:        General: Normal range of motion.     Cervical back: Normal range of motion. No rigidity.  Skin:    General: Skin is warm and dry.     Capillary Refill: Capillary refill takes less than 2 seconds.  Neurological:     Mental Status: She is alert. She is disoriented.     Cranial Nerves: No cranial nerve deficit.     Sensory: No sensory deficit.     Motor: No weakness.     Coordination: Coordination abnormal.     ED Results / Procedures / Treatments   Labs (all labs ordered are listed, but only abnormal results are displayed) Labs Reviewed  CBG  MONITORING, ED    EKG None  Radiology No results found.  Procedures Procedures - none  Medications Ordered in ED Medications - No data to display  ED Course  I have reviewed the triage vital signs and the nursing notes.  Pertinent labs & imaging results that were available during my care of the patient were reviewed by me and considered in my medical decision making (see chart for details).  Syncopal episode: Patient presenting after having syncopal episode this morning at breakfast table.  Patient CBG on arrival to the hospital 92 after patient had had a couple bites of food; could consider that patient was having hypoglycemic episode causing syncope. Systolic BP when patient was down 60-80, suggestive of vasovagal syncope; however here in th e ED has been 120s. EKG initially suggesting Afib (possible new finding for patient) and brady, could have contributed to syncope. CBC and CMP are within normal limit.  MR brain negative for stroke; however, showing chronic-appearing 5 mL left-sided fluid collection suggestive of hygroma.  Suspicious in the setting of patient sustaining multiple falls.  -Patient would benefit from observation for syncope given findings on EKG and MRI -IMTS called for admission   MDM Rules/Calculators/A&P                          Final Clinical Impression(s) / ED Diagnoses Final diagnoses:  None    Rx / DC Orders ED Discharge Orders    None       Dollene Cleveland, DO 04/20/20 2047    Blane Ohara, MD 04/21/20 1526

## 2020-04-20 NOTE — ED Triage Notes (Signed)
Pt BIB [facility] from [facility] c/o a syncopal episode. Pt was at breakfast in the dinning room when staff noticed pt laying face down on the table unresponsive. Staff called EMS. Upon EMS arrival pt was alert and oriented but BP was 60/42 and HR 36. Pt received 500 NS bolus with ems. Pt's last BP with EMS was 123/88. HR 56. Pt denies any pain.

## 2020-04-21 DIAGNOSIS — R55 Syncope and collapse: Secondary | ICD-10-CM | POA: Diagnosis not present

## 2020-04-21 DIAGNOSIS — I2510 Atherosclerotic heart disease of native coronary artery without angina pectoris: Secondary | ICD-10-CM | POA: Diagnosis not present

## 2020-04-21 DIAGNOSIS — F0390 Unspecified dementia without behavioral disturbance: Secondary | ICD-10-CM

## 2020-04-21 DIAGNOSIS — Z20822 Contact with and (suspected) exposure to covid-19: Secondary | ICD-10-CM | POA: Diagnosis not present

## 2020-04-21 DIAGNOSIS — G309 Alzheimer's disease, unspecified: Secondary | ICD-10-CM | POA: Diagnosis not present

## 2020-04-21 LAB — CBC
HCT: 36.3 % (ref 36.0–46.0)
Hemoglobin: 12.3 g/dL (ref 12.0–15.0)
MCH: 31.5 pg (ref 26.0–34.0)
MCHC: 33.9 g/dL (ref 30.0–36.0)
MCV: 92.8 fL (ref 80.0–100.0)
Platelets: 279 10*3/uL (ref 150–400)
RBC: 3.91 MIL/uL (ref 3.87–5.11)
RDW: 12.2 % (ref 11.5–15.5)
WBC: 7.4 10*3/uL (ref 4.0–10.5)
nRBC: 0 % (ref 0.0–0.2)

## 2020-04-21 LAB — COMPREHENSIVE METABOLIC PANEL
ALT: 13 U/L (ref 0–44)
AST: 18 U/L (ref 15–41)
Albumin: 3.9 g/dL (ref 3.5–5.0)
Alkaline Phosphatase: 56 U/L (ref 38–126)
Anion gap: 10 (ref 5–15)
BUN: 12 mg/dL (ref 8–23)
CO2: 24 mmol/L (ref 22–32)
Calcium: 9.6 mg/dL (ref 8.9–10.3)
Chloride: 105 mmol/L (ref 98–111)
Creatinine, Ser: 0.73 mg/dL (ref 0.44–1.00)
GFR, Estimated: >60 mL/min (ref >60)
Glucose, Bld: 90 mg/dL (ref 70–99)
Potassium: 3.4 mmol/L — ABNORMAL LOW (ref 3.5–5.1)
Sodium: 139 mmol/L (ref 135–145)
Total Bilirubin: 1.1 mg/dL (ref 0.3–1.2)
Total Protein: 6.4 g/dL — ABNORMAL LOW (ref 6.5–8.1)

## 2020-04-21 LAB — GLUCOSE, CAPILLARY: Glucose-Capillary: 91 mg/dL (ref 70–99)

## 2020-04-21 MED ORDER — LORAZEPAM 0.5 MG PO TABS: 0.2500 mg | ORAL_TABLET | Freq: Three times a day (TID) | ORAL | Status: DC | PRN

## 2020-04-21 MED ORDER — POTASSIUM CHLORIDE 10 MEQ/100ML IV SOLN
10.0000 meq | INTRAVENOUS | Status: AC
  Administered 2020-04-21 (×4): 10 meq via INTRAVENOUS
  Filled 2020-04-21 (×3): qty 100

## 2020-04-21 MED ORDER — POTASSIUM CHLORIDE 10 MEQ/100ML IV SOLN
INTRAVENOUS | Status: AC
  Filled 2020-04-21: qty 100

## 2020-04-21 MED ORDER — LORAZEPAM 2 MG/ML IJ SOLN: 0.5000 mg | Freq: Once | INTRAMUSCULAR | Status: DC

## 2020-04-21 MED ORDER — POTASSIUM CHLORIDE 20 MEQ PO PACK: 40.0000 meq | PACK | Freq: Two times a day (BID) | ORAL | Status: DC

## 2020-04-21 MED ORDER — OLANZAPINE 5 MG PO TBDP
5.0000 mg | ORAL_TABLET | Freq: Every day | ORAL | Status: DC
  Filled 2020-04-21: qty 1

## 2020-04-21 MED ORDER — SERTRALINE HCL 50 MG PO TABS
50.0000 mg | ORAL_TABLET | Freq: Every day | ORAL | Status: DC
  Administered 2020-04-21: 50 mg via ORAL
  Filled 2020-04-21: qty 1

## 2020-04-21 MED ORDER — QUETIAPINE FUMARATE 25 MG PO TABS: 12.5000 mg | ORAL_TABLET | Freq: Two times a day (BID) | ORAL | Status: DC

## 2020-04-21 MED ORDER — QUETIAPINE FUMARATE 25 MG PO TABS: 12.5000 mg | ORAL_TABLET | Freq: Every day | ORAL | Status: DC

## 2020-04-21 NOTE — Progress Notes (Signed)
  Date: 04/21/2020  Patient name: Vanessa Chavez  Medical record number: [id number]  Date of birth: 12/11/1940   I have seen and evaluated Vanessa Chavez and discussed their care with the Residency Team.  In brief, patient is an 80 year old female with a past medical history of osteoporosis, hyperlipidemia, CAD, anemia, dementia who presented to the ED with syncope x1 episode.  History obtained from chart as patient is unable to provide history at this time.  Per chart, patient [facility] to [facility] from her nursing facility after an acute syncopal episode.  The patient was found unresponsive at the breakfast table with her face and her food and was difficult to arouse initially.  On route to the hospital patient was noted to have decreased systolic blood pressure down to 62.  Patient also had increased number of falls over the last month secondary to patient wandering without using her walker.  Patient denies any complaints at this time.  Today, patient is combative and does not want to be examined.  When asked if she wants to go home today she states that she wants to get out of here and travel and not go home.  States that she feels well.  PMHx, Fam Hx, and/or Soc Hx : As per resident admit note  Vitals:   04/21/20 0711 04/21/20 1120  BP: (!) 159/67 113/74  Pulse:    Resp: 18 18  Temp: 97.8 F (36.6 C) 98.8 F (37.1 C)  SpO2: 97% 99%   General: Awake, confused, combative Skin: Warm and dry HEENT: Normocephalic, atraumatic Neuro: Awake, confused, moving all 4 extremities Psych: Combative, confused Patient refused further physical exam  Assessment and Plan: I have seen and evaluated the patient as outlined above. I agree with the formulated Assessment and Plan as detailed in the residents' note, with the following changes:   1.  Syncope: -Patient presented the ED with one episode of syncope at her nursing home and history of falls in the recent past secondary to  walking without a walker.  Etiology behind the patient's syncope remains uncertain at this time.  I suspect patient may have had a vasovagal episode given her systolic blood pressure was transiently low (noted by EMS).  No evidence of underlying infection at this time. -SLP follow-up and recommendations appreciated.  Continue with regular diet with thin liquids with supervision at meals -Initial EKG showed artifact.  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  No intervention needed for this at this time.  Resident discussed case with patient family and they did not want any aggressive intervention like neurosurgery. -Orthostatic vital signs done today with patient being mildly orthostatic going from lying to sitting but not orthostatic from sitting to standing or standing at 5 minutes -Would resume patient's home medication of Seroquel. -No further work-up at this time -Patient should be stable for DC to SNF today.  Earl Lagos, MD 3/17/20223:34 PM

## 2020-04-21 NOTE — Progress Notes (Signed)
Physician notified of patient behaviors.

## 2020-04-21 NOTE — Plan of Care (Signed)
  Problem: Health Behavior/Discharge Planning: Goal: Ability to manage health-related needs will improve Outcome: Progressing   Problem: Clinical Measurements: Goal: Ability to maintain clinical measurements within normal limits will improve Outcome: Progressing   Problem: Clinical Measurements: Goal: Will remain free from infection Outcome: Progressing   

## 2020-04-21 NOTE — NC FL2 (Addendum)
[place] MEDICAID FL2 LEVEL OF CARE SCREENING TOOL     IDENTIFICATION  Patient Name: Vanessa Chavez Birthdate: 1940/08/27 Sex: female Admission Date (Current Location): 04/20/2020  [facility] and [place] Number:  Producer, television/film/video and Address:  [facility] [place], [address] [place], [place] [address]      Provider Number: [id number]  Attending Physician Name and Address:  Earl Lagos, MD  Relative Name and Phone Number:       Current Level of Care: Hospital Recommended Level of Care: Memory Care Prior Approval Number:    Date Approved/Denied:   PASRR Number:    Discharge Plan: Other (Comment) (Memory Care)    Current Diagnoses: Patient Active Problem List   Diagnosis Date Noted  . Vasovagal syncope 04/20/2020  . Syncope 04/20/2020  . Dementia (HCC) 11/27/2018  . Collagenous colitis 03/27/2018  . Osteopenia of multiple sites 03/27/2018  . Memory loss 11/12/2017  . Osteoporosis 11/12/2017  . Hyperlipidemia 11/12/2017  . Carotid artery disease (HCC) 11/12/2017  . Anemia 11/12/2017    Orientation RESPIRATION BLADDER Height & Weight     Self  Normal Incontinent Weight:   Height:     BEHAVIORAL SYMPTOMS/MOOD NEUROLOGICAL BOWEL NUTRITION STATUS      Incontinent Diet (regular)  AMBULATORY STATUS COMMUNICATION OF NEEDS Skin   Extensive Assist Verbally Normal                       Personal Care Assistance Level of Assistance  Bathing,Feeding,Dressing Bathing Assistance: Maximum assistance Feeding assistance: Limited assistance Dressing Assistance: Maximum assistance     Functional Limitations Info             SPECIAL CARE FACTORS FREQUENCY   Physical therapy  PT 3x/wk with home health                  Contractures Contractures Info: Not present    Additional Factors Info  Code Status,Allergies,Psychotropic Code Status Info: DNR Allergies Info: NKA Psychotropic Info: Zoloft 50mg  daily, Zyprexa 5mg  daily  at bed         Current Medications (04/21/2020):  This is the current hospital active medication list Current Facility-Administered Medications  Medication Dose Route Frequency Provider Last Rate Last Admin  . acetaminophen (TYLENOL) tablet 650 mg  650 mg Oral Q6H PRN , MD       Or  . acetaminophen (TYLENOL) suppository 650 mg  650 mg Rectal Q6H PRN 04/23/2020, MD      . enoxaparin (LOVENOX) injection 40 mg  40 mg Subcutaneous QHS Dellia Cloud, MD   40 mg at 04/20/20 2242  . OLANZapine zydis (ZYPREXA) disintegrating tablet 5 mg  5 mg Oral QHS Katsadouros, Vasilios, MD      . polyethylene glycol (MIRALAX / GLYCOLAX) packet 17 g  17 g Oral Daily PRN 04/22/20, MD      . potassium chloride 10 MEQ/100ML IVPB           . pravastatin (PRAVACHOL) tablet 20 mg  20 mg Oral Daily 2243, MD   20 mg at 04/21/20 1041  . sertraline (ZOLOFT) tablet 50 mg  50 mg Oral Daily Dellia Cloud, MD   50 mg at 04/21/20 1031     Discharge Medications: TAKE these medications   acetaminophen 500 MG tablet Commonly known as: TYLENOL Take 500 mg by mouth every 6 (six) hours as needed for mild pain.   CALCIUM PO Take by mouth. 1200  mg tablets , she takes 2 daily at one time   LORazepam 0.5 MG tablet Commonly known as: ATIVAN Take 0.25 mg by mouth every 8 (eight) hours as needed for anxiety.   pravastatin 20 MG tablet Commonly known as: PRAVACHOL Take 20 mg by mouth daily.   QUEtiapine 25 MG tablet Commonly known as: SEROQUEL Take 12.5 mg by mouth 2 (two) times daily.   sertraline 50 MG tablet Commonly known as: ZOLOFT Take 50 mg by mouth daily.   vitamin B-12 1000 MCG tablet Commonly known as: CYANOCOBALAMIN Take 1,000 mcg by mouth daily.     Relevant Imaging Results:  Relevant Lab Results:   Additional Information SS#: [id number]  Baldemar Lenis, LCSW

## 2020-04-21 NOTE — Discharge Summary (Addendum)
Name: Vanessa Chavez MRN: [id number] DOB: March 14, 1940 80 y.o. PCP: Patient, No Pcp Per  Date of Admission: 04/20/2020  9:14 AM Date of Discharge:  04/21/20 Attending Physician: Earl Lagos, MD  Discharge Diagnosis: 1. Syncope 2. Dementia  Discharge Medications: Allergies as of 04/21/2020   No Known Allergies     Medication List    TAKE these medications   acetaminophen 500 MG tablet Commonly known as: TYLENOL Take 500 mg by mouth every 6 (six) hours as needed for mild pain.   CALCIUM PO Take by mouth. 1200 mg tablets , she takes 2 daily at one time   LORazepam 0.5 MG tablet Commonly known as: ATIVAN Take 0.25 mg by mouth every 8 (eight) hours as needed for anxiety.   pravastatin 20 MG tablet Commonly known as: PRAVACHOL Take 20 mg by mouth daily.   QUEtiapine 25 MG tablet Commonly known as: SEROQUEL Take 12.5 mg by mouth 2 (two) times daily.   sertraline 50 MG tablet Commonly known as: ZOLOFT Take 50 mg by mouth daily.   vitamin B-12 1000 MCG tablet Commonly known as: CYANOCOBALAMIN Take 1,000 mcg by mouth daily.       Disposition and follow-up:   Vanessa Chavez was [facility] from [facility] St[facility].  At the hospital follow up visit please address:  1.  Syncope- No evidence of arrhythmia or acute CVA, could be 2/2 centrally acting medications, continue to work on adjusting medications  Dementia- Return to memory care unit  2.  Labs / imaging needed at time of follow-up: N/A  3.  Pending labs/ test needing follow-up: N/A  Follow-up Appointments:    Hospital Course by problem list: 1.  Syncope: This is a 80 year old female with a history of osteoporosis, hyperlipidemia, CAD, anemia, and advanced dementia who is presenting after an episode of acute syncope.  She was noted to be significantly hypotensive and there is concern this is secondary to orthostatic hypotension versus vasovagal syncope.  She was also on  multiple centrally acting medications which could have contributed.  Patient was placed on telemetry with no evidence of arrhythmias.  An MRI was obtained that showed a new hygroma measuring approximately 5 mm in the subdural space, with a 1 to 2 mm right to left midline shift, no acute CVA noted.    She was initially kept n.p.o. upon admission, due to concern for aspiration.  She passed her swallow study and was continued on her home medications once this occurred the following day.  She was agitated prior to this and was given 5 mg disintegrating Zyprexa.  This improved her altered mental status.  I do suspect some component of vasovagal syncope, this did occur while patient was eating.  There was a discussion with daughter about further work-up however given patient's current medical status and underlying dementia it was felt that further work-up would not lead to a treatable cause. Her centrally acting medications were held during admission however patient became very agitated and combative, these were resumed on discharge.  Also discussed with her that the syncope may be due to orthostasis.  Patient's orthostatic vital signs were positive.  Offered IV fluids, but patient's daughter discussed with memory care facility director who is in agreement with the daughter to bring the patient back to facility.  2.  Dementia: Patient initially had her quetiapine, sertraline, and her Ativan held.  She became more agitated and combative while admitted.  We have resumed her medications on  discharge.  She will continue to work with the memory care unit and her PCP to continue adjusting her medications.   Discharge subjective: Initially this morning patient was agitated, she had not taken any of her medications.  This was due to pending speech evaluation.  Once patient's past speech evaluation she was restarted on her home medications and she became less agitated.  She would not allow [place] to examine her.  Update:  Daughter at bedside would like to take patient back to facility this evening as she believes it will help with the altered mental status.  Daughter is also concerned that she will not have 24-hour care here and will be lying around in her own feces.  Discussed with the daughter that I understand these concerns and that dementia patients normally do well back at their own facility where they are familiar.  Patient offered IV fluids but daughter declined would prefer mother to go back to facility.   Discharge Exam:   BP 113/74 (BP Location: Left Arm)    Pulse 67    Temp 98.8 F (37.1 C) (Oral)    Resp 18    SpO2 99%  Constitutional: frail appearing HENT: Ecchymosis around bilateral orbits Eyes: conjunctiva non-erythematous Neck: supple Cardiovascular: regular rate and rhythm per telemetry Pulmonary/Chest: normal work of breathing on room air Abdominal: soft, non-tender, non-distended MSK: normal bulk and tone Neurological: alert not oriented to place or time.  Knows her daughters. Skin: warm and dry Psych: Agitated   Pertinent Labs, Studies, and Procedures:  CBC Latest Ref Rng & Units 04/21/2020 04/20/2020 10/24/2018  WBC 4.0 - 10.5 K/uL 7.4 10.1 5.0  Hemoglobin 12.0 - 15.0 g/dL 17.4 94.4 96.7  Hematocrit 36.0 - 46.0 % 36.3 40.8 37.4  Platelets 150 - 400 K/uL 279 275 339   BMP Latest Ref Rng & Units 04/21/2020 04/20/2020 09/11/2018  Glucose 70 - 99 mg/dL 90 98 93  BUN 8 - 23 mg/dL 12 13 14   Creatinine 0.44 - 1.00 mg/dL 5.91 6.38  BUN/Creat Ratio 6 - 22 (calc) - - NOT APPLICABLE  Sodium 135 - 145 mmol/L 139 140 143  Potassium 3.5 - 5.1 mmol/L 3.4(L) 3.8 4.7  Chloride 98 - 111 mmol/L 105 108 106  CO2 22 - 32 mmol/L 24 23 30   Calcium 8.9 - 10.3 mg/dL 9.6 9.5 4.66   MRI brain 3/16: IMPRESSION: 1. No acute stroke. Generalized atrophy with moderate to marked chronic small-vessel ischemic change of the cerebral hemispheric deep white matter. 2. 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 No significant mass effect. One or 2 mm of right-to-left midline shift. This was not present on the prior study of February 2021.  Discharge Instructions: Discharge Instructions    Diet - low sodium heart healthy   Complete by: As directed    Discharge instructions   Complete by: As directed    You for allowing 4/16 to care for him as well.  After having a syncopal episode he was sent to [facility] emergency department at [facility].  A work-up was done for the syncope.  A head MRI scan was performed which did not show any new stroke.  There was chronic subdural fluid collection.  This was not seen on her prior imaging, and can be due to frequent falls even having.  Please follow-up with your primary care provider.  You are also found to have low blood pressures  upon standing up which may increase your risk of falls.  Please try to consume fluids and if need be talk with your facility about possibly getting IV fluids.  During her initial admission there was an EKG performed that may have been concerning for an arrhythmia, this was repeated and you were on telemetry overnight.  This did not show any acute arrhythmia.  Patient does have episodes of chest pain or fluttering in her chest, please follow-up with primary care provider.  She believes it is emergency please call [phone]   Increase activity slowly   Complete by: As directed        Signed: Belva Agee, MD 04/21/2020, 3:44 PM   Pager: [phone]

## 2020-04-21 NOTE — Progress Notes (Signed)
SLP Cancellation Note  Patient Details Name: Vanessa Chavez MRN: [id number] DOB: 1940/11/30   Cancelled treatment:       Reason Eval/Treat Not Completed: Other (comment); pt very combative agitated, confused; non-cooperative for attempt for bedside swallow evaluation. Will continue efforts.   Ardyth Gal MA, CCC-SLP Acute Rehabilitation Services   04/21/2020, 8:45 AM

## 2020-04-21 NOTE — Evaluation (Signed)
Clinical/Bedside Swallow Evaluation Patient Details  Name: Vanessa Chavez MRN: [id number] Date of Birth: Jun 26, 1940  Today's Date: 04/21/2020 Time: SLP Start Time (ACUTE ONLY): 1010 SLP Stop Time (ACUTE ONLY): 1032 SLP Time Calculation (min) (ACUTE ONLY): 22 min  Past Medical History:  Past Medical History:  Diagnosis Date  . CAD (coronary artery disease)   . Collagenous colitis   . Female bladder prolapse   . Herpes zoster   . Hyperlipidemia   . Normocytic anemia   . Osteoarthritis   . Osteopenia   . Right knee meniscal tear   . Senile purpura (HCC)    Past Surgical History:  Past Surgical History:  Procedure Laterality Date  . ABDOMINAL HYSTERECTOMY    . BREAST EXCISIONAL BIOPSY    . CATARACT EXTRACTION Right 04/2017  . CATARACT EXTRACTION Left 05/2017   HPI:  Vanessa Chavez is a 80 y.o. female with a pertinent PMH of osteoporosis, hyperlipidemia, carotid artery disease, anemia, dementia, who presents to [facility] after a syncopal episode.   Assessment / Plan / Recommendation Clinical Impression  Pt participatory in second attempt for clinical swallow eval. Overall remains easily agitated, combative at times due to advanced dementia. Pt with natural dentition, some xerostomia, not agreeable for oral care. Pt consumed thin liquids via straw. Delayed cough noted x2 throughout PO trials, though vocal quality remained clear. Pt with prolonged mastication of regular POs though with extended time was able to clear oral cavity. Recommend regular thin liquid diet with full supervision with all POs. SLP to follow up for diet tolerance.   SLP Visit Diagnosis: Dysphagia, oral phase (R13.11);Dysphagia, unspecified (R13.10)    Aspiration Risk  Mild aspiration risk    Diet Recommendation   Regular, thin liquids  Medication Administration: Crushed with puree    Other  Recommendations Oral Care Recommendations: Oral care BID   Follow up Recommendations Skilled Nursing facility       Frequency and Duration min 1 x/week  1 week       Prognosis Prognosis for Safe Diet Advancement: Good Barriers to Reach Goals: Cognitive deficits;Behavior      Swallow Study   General Date of Onset: 04/20/20 HPI: Vanessa Chavez is a 80 y.o. female with a pertinent PMH of osteoporosis, hyperlipidemia, carotid artery disease, anemia, dementia, who presents to [facility] after a syncopal episode. Type of Study: Bedside Swallow Evaluation Previous Swallow Assessment: none on file Diet Prior to this Study: NPO Temperature Spikes Noted: No Respiratory Status: Room air History of Recent Intubation: No Behavior/Cognition: Alert;Agitated Oral Cavity Assessment: Dry Oral Care Completed by SLP: Other (Comment) (pt refused) Oral Cavity - Dentition: Adequate natural dentition Vision: Functional for self-feeding Self-Feeding Abilities: Needs assist Patient Positioning: Upright in bed Baseline Vocal Quality: Normal Volitional Swallow: Unable to elicit    Oral/Motor/Sensory Function Overall Oral Motor/Sensory Function: Other (comment) (appeared WFL pt non cooperative for formal assessment)   Ice Chips Ice chips: Not tested   Thin Liquid Thin Liquid: Impaired Presentation: Straw Pharyngeal  Phase Impairments: Suspected delayed Swallow;Multiple swallows;Cough - Delayed    Nectar Thick Nectar Thick Liquid: Not tested   Honey Thick Honey Thick Liquid: Not tested   Puree Puree: Within functional limits   Solid     Solid: Impaired Presentation: Self Fed Pharyngeal Phase Impairments: Suspected delayed Swallow;Multiple swallows;Throat Clearing - Delayed      Fin Hupp H. MA, CCC-SLP Acute Rehabilitation Services   04/21/2020,10:53 AM

## 2020-04-21 NOTE — TOC Transition Note (Signed)
Transition of Care [facility]) - CM/SW Discharge Note   Patient Details  Name: Vanessa Chavez MRN: [id number] Date of Birth: 11-21-1940  Transition of Care [facility]) CM/SW Contact:  Baldemar [place], [place] Phone Number: 04/21/2020, 4:15 PM   Clinical Narrative:   CSW notified by RN that patient being discharged back to memory care this afternoon. CSW confirmed with MD, and spoke with [facility] to confirm that they were able to take the patient back. CSW sent discharge information, and spoke with daughter at bedside who will provide transport and is ready for patient to return to memory care as soon as possible. Patient will need to continue with physical therapy upon return to [facility], and [facility] is aware.  Nurse to call report to [phone].    Final next level of care: Assisted Living Barriers to Discharge: Barriers Resolved   Patient Goals and CMS Choice Patient states their goals for this hospitalization and ongoing recovery are:: patient unable to participate in goal setting, only oriented to self CMS Medicare.gov Compare Post Acute Care list provided to:: Patient Represenative (must comment) Choice offered to / list presented to : Adult Children  Discharge Placement              Patient chooses bed at: [facility] [place] Patient to be transferred to facility by: Family car Name of family member notified: Joni Reining Patient and family notified of of transfer: 04/21/20  Discharge Plan and Services                                     Social Determinants of Health (SDOH) Interventions     Readmission Risk Interventions No flowsheet data found.

## 2020-04-21 NOTE — Progress Notes (Incomplete)
HD#0 Subjective:  Overnight Events: ***   Vanessa Chavez states that she wants to break out of the hospital. She states there are people that are trying to stab her in the back. She states she has not been falling recently and does not recall losing consciousness. She denies any CP, shortness of breath, or any pain. She states she would like her restraints off to "pin [place] 1:1".   To dp: call daughter  Objective:  Vital signs in last 24 hours: Vitals:   04/20/20 2258 04/20/20 2330 04/21/20 0118 04/21/20 0300  BP: 138/67 136/66 (!) 146/78 140/71  Pulse: 76 70 66 67  Resp: 18 18 18 18   Temp: 98.4 F (36.9 C) 98 F (36.7 C) 98 F (36.7 C) 98 F (36.7 C)  TempSrc: Oral  Oral Oral  SpO2: 99% 98% 98% 98%   Supplemental O2: Room Air SpO2: 98 %   Physical Exam:   Exam limited as patient requests not to be physically examined.   Constitutional: well-appearing *** sitting in ***, in no acute distress HENT: normocephalic atraumatic, mucous membranes moist Eyes: conjunctiva non-erythematous Neck: supple Cardiovascular: regular rate and rhythm, no m/r/g Pulmonary/Chest: normal work of breathing on room air, lungs clear to auscultation bilaterally Abdominal: soft, non-tender, non-distended MSK: normal bulk and tone Neurological: alert & oriented x 3, 5/5 strength in bilateral upper and lower extremities, normal gait Skin: warm and dry Psych: ***  There were no vitals filed for this visit.   Intake/Output Summary (Last 24 hours) at 04/21/2020 0712 Last data filed at 04/21/2020 0515 Gross per 24 hour  Intake 464.48 ml  Output -  Net 464.48 ml   Net IO Since Admission: 464.48 mL [04/21/20 0712]  Pertinent Labs: CBC Latest Ref Rng & Units 04/21/2020 04/20/2020 10/24/2018  WBC 4.0 - 10.5 K/uL 7.4 10.1 5.0  Hemoglobin 12.0 - 15.0 g/dL 10/26/2018 54.0 08.6  Hematocrit 36.0 - 46.0 % 36.3 40.8 37.4  Platelets 150 - 400 K/uL 279 275 339    CMP Latest Ref Rng & Units 04/21/2020 04/20/2020  09/11/2018  Glucose 70 - 99 mg/dL 90 98 93  BUN 8 - 23 mg/dL 12 13 14   Creatinine 0.44 - 1.00 mg/dL 11/11/2018 9.50  Sodium 135 - 145 mmol/L 139 140 143  Potassium 3.5 - 5.1 mmol/L 3.4(L) 3.8 4.7  Chloride 98 - 111 mmol/L 105 108 106  CO2 22 - 32 mmol/L 24 23 30   Calcium 8.9 - 10.3 mg/dL 9.6 9.5 9.32  Total Protein 6.5 - 8.1 g/dL 6.4(L) 6.8 6.7  Total Bilirubin 0.3 - 1.2 mg/dL 1.1 0.7 0.6  Alkaline Phos 38 - 126 U/L 56 59 -  AST 15 - 41 U/L 18 17 20   ALT 0 - 44 U/L 13 15 18     Imaging: MR BRAIN WO CONTRAST  Result Date: 04/20/2020 CLINICAL DATA:  Syncopal episode. Unresponsive at the breakfast table. EXAM: MRI HEAD WITHOUT CONTRAST TECHNIQUE: Multiplanar, multiecho pulse sequences of the brain and surrounding structures were obtained without intravenous contrast. COMPARISON:  04/04/2019 FINDINGS: Brain: Diffusion imaging does not show any acute or subacute infarction. The brainstem and cerebellum are unremarkable. Cerebral hemispheres show chronic atrophy with moderate to marked chronic small-vessel ischemic change of the deep white matter. No cortical or large vessel territory infarction. There is a subdural fluid collection on the left along the convexity with maximal thickness of 5 mm. 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 No  significant mass effect. One or 2 mm of right-to-left midline shift. Chronic ventriculomegaly is stable. Vascular: Major vessels at the base of the brain show flow. Skull and upper cervical spine: Negative Sinuses/Orbits: Clear/normal Other: None IMPRESSION: 1. No acute stroke. Generalized atrophy with moderate to marked chronic small-vessel ischemic change of the cerebral hemispheric deep white matter. 2. 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 No significant mass effect. One or 2 mm of right-to-left midline shift. This was not present on  the prior study of February 2021. Electronically Signed   By: Paulina Fusi M.D.   On: 04/20/2020 12:27    Assessment/Plan:   Active Problems:   Vasovagal syncope   Syncope   Patient Summary: Vanessa Chavez is a 80 y.o. with a pertinent PMH of ***, who presented with *** and admitted for ***.    *** ***  *** ***  *** ***  *** ***  Diet: {NAMES:3044014::"Normal","Heart Healthy","Carb-Modified","Renal","Carb/Renal","NPO","TPN","Tube Feeds"} IVF: {NAMES:3044014::"None","NS","1/2 NS","LR","D5","D10"},{NAMES:3044014::"None","10cc/hr","25cc/hr","50cc/hr","75cc/hr","100cc/hr","110cc/hr","125cc/hr","Bolus"} VTE: {NAMES:3044014::"Heparin","Enoxaparin","SCDs","NOAC","None"} Code: {NAMES:3044014::"Full","DNR","DNI","DNR/DNI","Comfort Care","Unknown"} PT/OT recs: {NAMES:3044014::"None","Pending","CIR","SNF for Subacute PT","LTAC","Home Health"}, {Assistive Devices PFX:90240}. TOC recs: *** Family Update:   Dispo: Anticipated discharge to {Discharge Destination:18313::"Home"} in {NUMBERS:20191} days pending ***.   Thalia Bloodgood DO Internal Medicine Resident PGY-1 Pager [phone] Please contact the on call pager after 5 pm and on weekends at [phone].

## 2020-04-25 ENCOUNTER — Other Ambulatory Visit: Payer: Self-pay

## 2020-04-25 ENCOUNTER — Other Ambulatory Visit: Payer: Medicare Other | Admitting: Nurse Practitioner

## 2020-04-25 DIAGNOSIS — R296 Repeated falls: Secondary | ICD-10-CM

## 2020-04-25 DIAGNOSIS — Z515 Encounter for palliative care: Secondary | ICD-10-CM

## 2020-04-25 NOTE — Progress Notes (Addendum)
[facility] Consult Note Telephone: [phone]  Fax: [phone]  PATIENT NAME: Vanessa Chavez [place] [address] [phone] (home)  DOB: [birth year] MRN: [id number]  PRIMARY CARE PROVIDER:    Gus Puma, NP [facility] (Facility provider)  REFERRING PROVIDER:   Gus Puma, NP  RESPONSIBLE PARTY:   Extended Emergency Contact Information Primary Emergency Contact: Record,Nicole Address: [address]          [place], [place] [address] [place] of [place] Phone: [phone] Work Phone: [phone] Mobile Phone: [phone] Relation: Daughter  I met face to face with patient in facility.   ASSESSMENT AND RECOMMENDATIONS:   Advance Care Planning:  Pending ACP discussion with family. Unable to reach daughter on phone, will attempt to reach again.  Symptom Management:  Frequent fall: Most recent fall was today. Fell while walking without assistive device, no injury reported. Patient noted with problem with visual field, was unable to accurately touch my finger when asked to touch it with my fore finger pointing upward. No report of fever, chills, SOB, or uncontrolled pain. Recommendation: vision problem may likely be related to the new finding noted on MRI completed during her last hospitalization. Patient may need further diagnostics to confirm cause of visual disturbance. Plan of care would depend on patient's goal of care. Continue fall precautions per facility protocol, ensure safety. Dementia: Behavior related to dementia is controlled on Quetiapine Fumarate 12.7m twice a day and Sertraline 550mdaily. Discussion on disease trajectory of dementia with staff, as it is progressive and terminal and likely to eventually lead to dysphagia, weight loss and immobility.  Addendum 04/26/2020 ACP discussion held with daughter NiElmyra RicksGoal of care is for patient to be as comfortable as possible. No desire for  aggressive treatment.   Follow up Palliative Care Visit: Palliative care will continue to follow for complex decision making and symptom management. Return in about 4-8 weeks or prn.  Family /Caregiver/Community Supports: patient is a resident in a memory care unit of [facility].  Cognitive / Functional decline: Patient awake and alert, she is confused, oriented to self only. She requires moderate assistance completing her ADLs, able to feed self. Was ambulating with independently, due to recent frequent falls now ambulates with a wheelchair.  I spent 40 minutes providing this consultation, time includes time spent with patient, chart review, provider coordination, and documentation. More than 50% of the time in this consultation was spent counseling and coordinating communication.   CHIEF COMPLAINT: frequent falls   History obtained from review of EMR and discussion with facility staff, and interview with patient.  Records reviewed and summarized bellow.  HISTORY OF PRESENT ILLNESS:  AnRENDI MAPELs a [age].o. year old female with multiple medical problems including Dementia (FAST 6d), osteoporosis, CAD, anemia, HLD. Patient with recent problems with frequent falls. She is s/p hospital visit admission for syncope. She had syncope episode that was said to be likely related to vasovagal situation. Review of hospital discharge summary, had MRI in hospital which showed a new hygroma measuring approximately 50m24mn the subdural space with 1 to 2 mm right to left midline shift, no CVA noted. Staff report patient with visual field distortion, having difficulty focusing on objects. Palliative Care was asked to help address advance care planning and goals of care. This is an initial visit.  CODE STATUS: DNR  PPS: 40%  HOSPICE ELIGIBILITY/DIAGNOSIS: TBD  Review of Systems:  All systems reviewed and are  negative except as documented in history of present illness above.   Physical Exam: Current  and past weights: 130.6lbs down from 131.8lbs last month and 132.2lbs three months ago General: frail appearing, cooperative, sitting in wheelchair in NAD EYES: anicteric sclera, lids intact, no discharge  ENMT: intact hearing, oral mucous membranes moist CV:  no LE edema Pulmonary: no increased work of breathing, no cough, no audible wheezes, room air Abdomen:  no ascites GU: deferred MSK: no contractures of LE,  ambulatory Skin: warm and dry, mild bruising noted right lower eyelid. Neuro: Generalized weakness, moderate cognitive impairment Psych: non-anxious affect today Hem/lymph/immuno: no widespread bruising   PAST MEDICAL HISTORY:  Past Medical History:  Diagnosis Date  . CAD (coronary artery disease)   . Collagenous colitis   . Female bladder prolapse   . Herpes zoster   . Hyperlipidemia   . Normocytic anemia   . Osteoarthritis   . Osteopenia   . Right knee meniscal tear   . Senile purpura ([place])     SOCIAL HX:  Social History   Tobacco Use  . Smoking status: Former Smoker    Packs/day: 1.50    Years: 40.00    Pack years: 60.00    Quit date: 2008    Years since quitting: 14.2  . Smokeless tobacco: Never Used  Substance Use Topics  . Alcohol use: Yes    Comment: occasional   FAMILY HX:  Family History  Problem Relation Age of Onset  . Pneumonia Mother 81  . Hypertension Mother   . Cirrhosis Father 24       liver  . Cancer - Lung Father   . Cancer Brother        sinuses  . Suicidality Son   . Heart Problems Maternal Grandmother 47    ALLERGIES: No Known Allergies   PERTINENT MEDICATIONS:  Outpatient Encounter Medications as of 04/25/2020  Medication Sig  . acetaminophen (TYLENOL) 500 MG tablet Take 500 mg by mouth every 6 (six) hours as needed for mild pain.  Marland Kitchen CALCIUM PO Take by mouth. 1200 mg tablets , she takes 2 daily at one time  . LORazepam (ATIVAN) 0.5 MG tablet Take 0.25 mg by mouth every 8 (eight) hours as needed for anxiety.  . pravastatin  (PRAVACHOL) 20 MG tablet Take 20 mg by mouth daily.  . QUEtiapine (SEROQUEL) 25 MG tablet Take 12.5 mg by mouth 2 (two) times daily.  . sertraline (ZOLOFT) 50 MG tablet Take 50 mg by mouth daily.  . vitamin B-12 (CYANOCOBALAMIN) 1000 MCG tablet Take 1,000 mcg by mouth daily.   No facility-administered encounter medications on file as of 04/25/2020.    Thank you for the opportunity to participate in the care of Ms Hollyanne B. Kramar. The palliative care team will continue to follow. Please call our office at [phone] if we can be of additional assistance.  Jari Favre , DNP, AGPCNP-BC

## 2020-04-28 ENCOUNTER — Other Ambulatory Visit: Payer: Self-pay

## 2020-04-28 ENCOUNTER — Non-Acute Institutional Stay: Payer: Medicare Other | Admitting: Nurse Practitioner

## 2020-04-28 DIAGNOSIS — Z515 Encounter for palliative care: Secondary | ICD-10-CM

## 2020-04-28 NOTE — Progress Notes (Signed)
Therapist, nutritional Palliative Care Consult Note Telephone: [phone]  Fax: [phone]  PATIENT NAME: Vanessa Chavez [address] [place] [address] [phone] (home)  DOB: [birth year] MRN: [id number]  PRIMARY CARE PROVIDER:    Patient, No Pcp Per,  No address on file None  REFERRING PROVIDER:   No referring provider defined for this encounter. N/A  RESPONSIBLE PARTY:   Extended Emergency Contact Information Primary Emergency Contact: Record,Nicole Address: [address]          [place], [place] [address] [place] Home Phone: [phone] Work Phone: [phone] Mobile Phone: [phone] Relation: Daughter  This visit was done via telephone from my office and it was initiated and consent by this patient's family.  ASSESSMENT AND RECOMMENDATIONS:   Advance Care Planning: ACP discussion held via telephone with patient's daughter Joni Reining, reason for the consult is to complete a MOST form. Daughter reiterated patient's goal of care as comfort as comfort. Daughter report family's desire is to avoid hospitalization if possible, want patient to be treated in place in the facility, want to be consulted before any hospital transfer.The need to complete a MOST form was discussed, patient and daughter expressed interest in completing a MOST form. Discussed and reviewed sections of the form in detail, opportunity for questions given, all questions answered. Form completed with daughter, signed form will be left in the facility for daughter to sign when she visits this weekend. Details of MOST form include, comfort measures only, determine use or limitation of antibiotics, IV fluids for a defined trial period, no feeding tube. Palliative care will continue to provided support to patient, family and medical team.  I spent 20 minutes providing this consultation. More than 50% of the time in this consultation was spent counseling and coordinating  communication.   History obtained from review of [facility] Epic EMR. Records reviewed and summarized bellow.  HISTORY OF PRESENT ILLNESS:  Vanessa Chavez is a 80 y.o. year old female with multiple medical problems including Dementia (FAST 6d), osteoporosis, CAD, anemia, HLD. Patient with recent problems with frequent falls. She is s/p hospital visit admission for syncope. She had syncope episode that was said to be likely related to vasovagal situation. Review of hospital discharge summary, had MRI in hospital which showed a new hygroma measuring approximately 56mm in the subdural space with 1 to 2 mm right to left midline shift, no CVA noted. Staff report patient with visual field distortion, having difficulty focusing on objects. Palliative Care was asked to help address advance care planning and goals of care. T   PAST MEDICAL HISTORY:  Past Medical History:  Diagnosis Date  . CAD (coronary artery disease)   . Collagenous colitis   . Female bladder prolapse   . Herpes zoster   . Hyperlipidemia   . Normocytic anemia   . Osteoarthritis   . Osteopenia   . Right knee meniscal tear   . Senile purpura (HCC)     SOCIAL HX:  Social History   Tobacco Use  . Smoking status: Former Smoker    Packs/day: 1.50    Years: 40.00    Pack years: 60.00    Quit date: 2008    Years since quitting: 14.2  . Smokeless tobacco: Never Used  Substance Use Topics  . Alcohol use: Yes    Comment: occasional   FAMILY HX:  Family History  Problem Relation Age of Onset  . Pneumonia Mother 98  . Hypertension Mother   .  Cirrhosis Father 17       liver  . Cancer - Lung Father   . Cancer Brother        sinuses  . Suicidality Son   . Heart Problems Maternal Grandmother 47    ALLERGIES: No Known Allergies   PERTINENT MEDICATIONS:  Outpatient Encounter Medications as of 04/28/2020  Medication Sig  . acetaminophen (TYLENOL) 500 MG tablet Take 500 mg by mouth every 6 (six) hours as needed for mild  pain.  Marland Kitchen CALCIUM PO Take by mouth. 1200 mg tablets , she takes 2 daily at one time  . LORazepam (ATIVAN) 0.5 MG tablet Take 0.25 mg by mouth every 8 (eight) hours as needed for anxiety.  . pravastatin (PRAVACHOL) 20 MG tablet Take 20 mg by mouth daily.  . QUEtiapine (SEROQUEL) 25 MG tablet Take 12.5 mg by mouth 2 (two) times daily.  . sertraline (ZOLOFT) 50 MG tablet Take 50 mg by mouth daily.  . vitamin B-12 (CYANOCOBALAMIN) 1000 MCG tablet Take 1,000 mcg by mouth daily.   No facility-administered encounter medications on file as of 04/28/2020.    Thank you for the opportunity to participate in the care of Ms. Avamae Sam. The palliative care team will continue to follow. Please call our office at [phone] if we can be of additional assistance.  Reuel Boom, DNP, AGPCNP-BC
